# Patient Record
Sex: Female | Born: 1940 | Race: Black or African American | Hispanic: No | Marital: Married | Smoking: Former smoker
Health system: Southern US, Community
[De-identification: ages and names within clinical notes are randomized; demographics above are authoritative.]

## PROBLEM LIST (undated history)

## (undated) DIAGNOSIS — Z9889 Other specified postprocedural states: Secondary | ICD-10-CM

## (undated) DIAGNOSIS — Z8249 Family history of ischemic heart disease and other diseases of the circulatory system: Secondary | ICD-10-CM

## (undated) DIAGNOSIS — K219 Gastro-esophageal reflux disease without esophagitis: Secondary | ICD-10-CM

## (undated) DIAGNOSIS — G4730 Sleep apnea, unspecified: Secondary | ICD-10-CM

## (undated) DIAGNOSIS — F419 Anxiety disorder, unspecified: Secondary | ICD-10-CM

## (undated) DIAGNOSIS — M1990 Unspecified osteoarthritis, unspecified site: Secondary | ICD-10-CM

## (undated) DIAGNOSIS — D696 Thrombocytopenia, unspecified: Secondary | ICD-10-CM

## (undated) DIAGNOSIS — E119 Type 2 diabetes mellitus without complications: Secondary | ICD-10-CM

## (undated) DIAGNOSIS — E785 Hyperlipidemia, unspecified: Secondary | ICD-10-CM

## (undated) DIAGNOSIS — R112 Nausea with vomiting, unspecified: Secondary | ICD-10-CM

## (undated) DIAGNOSIS — D704 Cyclic neutropenia: Secondary | ICD-10-CM

## (undated) DIAGNOSIS — I10 Essential (primary) hypertension: Secondary | ICD-10-CM

## (undated) HISTORY — DX: Sleep apnea, unspecified: G47.30

## (undated) HISTORY — PX: KNEE SURGERY: SHX244

## (undated) HISTORY — DX: Family history of ischemic heart disease and other diseases of the circulatory system: Z82.49

## (undated) HISTORY — DX: Type 2 diabetes mellitus without complications: E11.9

## (undated) HISTORY — DX: Cyclic neutropenia: D70.4

## (undated) HISTORY — DX: Hyperlipidemia, unspecified: E78.5

---

## 1970-07-09 HISTORY — PX: TONSILLECTOMY: SUR1361

## 1983-07-10 HISTORY — PX: VAGINAL HYSTERECTOMY: SUR661

## 2001-07-09 HISTORY — PX: TRANSTHORACIC ECHOCARDIOGRAM: SHX275

## 2002-03-18 ENCOUNTER — Inpatient Hospital Stay (HOSPITAL_COMMUNITY): Admission: EM | Admit: 2002-03-18 | Discharge: 2002-03-19 | Payer: Self-pay | Admitting: *Deleted

## 2002-03-18 ENCOUNTER — Encounter: Payer: Self-pay | Admitting: *Deleted

## 2002-03-19 ENCOUNTER — Encounter: Payer: Self-pay | Admitting: *Deleted

## 2002-11-05 ENCOUNTER — Other Ambulatory Visit: Admission: RE | Admit: 2002-11-05 | Discharge: 2002-11-05 | Payer: Self-pay | Admitting: Obstetrics and Gynecology

## 2002-12-22 ENCOUNTER — Encounter: Payer: Self-pay | Admitting: Emergency Medicine

## 2002-12-22 ENCOUNTER — Emergency Department (HOSPITAL_COMMUNITY): Admission: EM | Admit: 2002-12-22 | Discharge: 2002-12-22 | Payer: Self-pay | Admitting: Emergency Medicine

## 2002-12-24 ENCOUNTER — Ambulatory Visit (HOSPITAL_COMMUNITY): Admission: RE | Admit: 2002-12-24 | Discharge: 2002-12-24 | Payer: Self-pay | Admitting: Orthopaedic Surgery

## 2002-12-24 ENCOUNTER — Encounter: Payer: Self-pay | Admitting: Orthopaedic Surgery

## 2004-09-23 ENCOUNTER — Emergency Department (HOSPITAL_COMMUNITY): Admission: EM | Admit: 2004-09-23 | Discharge: 2004-09-23 | Payer: Self-pay | Admitting: Emergency Medicine

## 2005-07-25 ENCOUNTER — Encounter: Payer: Self-pay | Admitting: Internal Medicine

## 2005-07-25 ENCOUNTER — Ambulatory Visit: Payer: Self-pay | Admitting: Internal Medicine

## 2005-07-25 ENCOUNTER — Ambulatory Visit (HOSPITAL_COMMUNITY): Admission: RE | Admit: 2005-07-25 | Discharge: 2005-07-25 | Payer: Self-pay | Admitting: Internal Medicine

## 2006-09-24 ENCOUNTER — Ambulatory Visit (HOSPITAL_COMMUNITY): Admission: RE | Admit: 2006-09-24 | Discharge: 2006-09-24 | Payer: Self-pay | Admitting: Family Medicine

## 2008-07-15 ENCOUNTER — Ambulatory Visit: Payer: Self-pay | Admitting: Internal Medicine

## 2008-08-09 ENCOUNTER — Encounter: Payer: Self-pay | Admitting: Internal Medicine

## 2008-08-09 ENCOUNTER — Ambulatory Visit: Payer: Self-pay | Admitting: Internal Medicine

## 2008-08-09 ENCOUNTER — Ambulatory Visit (HOSPITAL_COMMUNITY): Admission: RE | Admit: 2008-08-09 | Discharge: 2008-08-09 | Payer: Self-pay | Admitting: Internal Medicine

## 2008-08-09 HISTORY — PX: COLONOSCOPY: SHX174

## 2008-08-12 ENCOUNTER — Encounter: Payer: Self-pay | Admitting: Internal Medicine

## 2008-08-27 ENCOUNTER — Encounter (INDEPENDENT_AMBULATORY_CARE_PROVIDER_SITE_OTHER): Payer: Self-pay

## 2009-11-11 ENCOUNTER — Ambulatory Visit (HOSPITAL_COMMUNITY): Admission: RE | Admit: 2009-11-11 | Discharge: 2009-11-11 | Payer: Self-pay | Admitting: Family Medicine

## 2009-11-21 ENCOUNTER — Ambulatory Visit (HOSPITAL_COMMUNITY): Admission: RE | Admit: 2009-11-21 | Discharge: 2009-11-21 | Payer: Self-pay | Admitting: Family Medicine

## 2010-11-21 NOTE — Op Note (Signed)
Danielle [name], Danielle [name]             ACCOUNT NO.:  [id number]   MEDICAL RECORD NO.:  [id number]          PATIENT TYPE:  AMB   LOCATION:  DAY                           FACILITY:  [facility]   PHYSICIAN:  R. Roetta Sessions, M.D. DATE OF BIRTH:  December 23, 1940   DATE OF PROCEDURE:  DATE OF DISCHARGE:                               OPERATIVE REPORT   Colonoscopy with biopsy.   INDICATIONS FOR PROCEDURE:  The patient is a 70 year old African  American lady with a history of colonic adenoma who is here for  surveillance examination.  Last colonoscopy was in 2007 in which she had  an adenoma removed.  She is having no lower GI tract symptoms.  She is  here somewhat early for surveillance.  She is desirous of colonoscopy  for recheck.  Risks, benefits, alternatives, limitations have been  reviewed previously and again today in the office.  Please see the  documentation in the medical record.   PROCEDURE NOTE:  O2 saturation, blood pressure, pulse, respirations were  monitored throughout the entire procedure.   CONSCIOUS SEDATION:  Versed 3 mg IV, Demerol 50 mg IV in divided doses.   INSTRUMENT:  Pentax video chip system.   FINDINGS:  Digital rectal exam revealed no abnormalities.  Endoscopic  findings:  The prep was good.  Colon:  Colonic mucosa was surveyed from  the rectosigmoid junction through the left transverse, right colon, to  the appendiceal orifice, ileocecal valve, and cecum.  These structures  were well seen and photographed for the record.  From this level, the  scope was slowly and cautiously withdrawn.  All previously mentioned  mucosal surfaces were again seen.  The patient had a few underlying  right-sided diverticula and a tiny polypoid area of mucosa on the  proximal side of the ileocecal valve and the cecum which was cold  biopsied/removed.  Remainder of the colonic mucosa appeared normal.  Scope was pulled down the rectum.  A thorough examination of rectal  mucosa including  retroflexion of the anal verge demonstrated no  abnormalities.  The patient tolerated the procedure well, was reactive  to endoscopy.   IMPRESSION:  Normal rectum, few right-sided diverticula, diminutive  polypoid mucosa at the ileocecal valve status post cold biopsy removal.  Remainder of colonic mucosa appeared normal.   RECOMMENDATIONS:  1. Diverticulosis literature provided to [name].  2. Followup on path.  3. Further recommendations to follow.      Jonathon Bellows, M.D.  Electronically Signed     RMR/MEDQ  D:  08/09/2008  T:  08/09/2008  Job:  [id number]   cc:   Kirk Ruths, M.D.  Fax: [phone]

## 2010-11-21 NOTE — H&P (Signed)
Danielle [name], Danielle [name]             ACCOUNT NO.:  [id number]   MEDICAL RECORD NO.:  [id number]         PATIENT TYPE:  AMB   LOCATION:  DAY                           FACILITY:  [facility]   PHYSICIAN:  R. Roetta Sessions, M.D. DATE OF BIRTH:  05-18-1941   DATE OF ADMISSION:  DATE OF DISCHARGE:  LH                              HISTORY & PHYSICAL   PRIMARY CARE PHYSICIAN:  Kirk Ruths, MD   PHYSICIAN SIGNING NOTE:  Jonathon Bellows, MD   CHIEF COMPLAINT:  Followup surveillance colonoscopy, history of  adenomatous polyp.   HISTORY OF PRESENT ILLNESS:  Danielle [name] is a 70 year old African  female.  She was found to have inflamed focally adenomatous polyp in her  ascending colon on July 25, 2005, on screening colonoscopy by Dr.  Jena Gauss.  The remainder of her exam was normal.  She denies any GI  complaints at this time.  She does have occasional GERD and may take a  couple of Protonix on a p.r.n. basis.  Denies any dysphagia or  odynophagia.  Her weight has remained stable.  Her appetite is good.  She denies any abdominal pain, rectal bleeding, melena, diarrhea, or  constipation.   PAST MEDICAL AND SURGICAL HISTORY:  Adenomatous polyp as described in  HPI, hypercholesterolemia, asthma, seasonal allergies, partial  hysterectomy, tonsillectomy, tubal ligation.   CURRENT MEDICATIONS:  1. Hydrochlorothiazide 125 mg daily.  2. Zocor 40 mg daily.  3. Protonix 40 mg daily.  4. Aspirin 81 mg daily.  5. Centrum multivitamin daily.  6. Omega-3 fish oil daily.  7. Caltrate 600 mg.  8. Vitamin D daily.  9. Garlic oil daily.  10.Vitamin E daily.  11.Travatan at bedtime.  12.Alphagan b.i.d.  13.Albuterol p.r.n.  14.Flovent p.r.n.   ALLERGIES:  No known drug allergies.   FAMILY HISTORY:  There is no known family history of carcinoma or other  chronic GI problems.  Mother deceased at 53 secondary to leukemia.  Father deceased at 27 secondary to pneumonia.  She has 4 healthy   brothers.   SOCIAL HISTORY:  Danielle [name] is married.  She has 1 healthy grown son.  She is a retired Nature conservation officer.  She denies any tobacco or drug  use.  She consumes 1-2 glasses of wine a couple of times per year.   REVIEW OF SYSTEMS:  See HPI, otherwise negative.   PHYSICAL EXAMINATION:  VITAL SIGNS:  Weight 185.5 pounds, height 65  inches, temp 97.7, blood pressure 118/78, pulse 64.  GENERAL:  She is a well-developed, well-nourished [place] American  female who is alert, oriented, pleasant, cooperative in no acute  distress.  HEENT.  Sclerae clear, nonicteric. Conjunctivae pink. Oropharynx pink  and moist without any lesions.  NECK: Supple without mass or thyromegaly.  CHEST:  Heart regular rate and rhythm.  Normal S1 and S2 without  murmurs, clicks, rubs, or gallops.  LUNGS:  Clear to auscultation bilaterally.  ABDOMEN:  Flat with positive bowel sounds x4.  No bruits auscultated.  Soft, nontender, nondistended without palpable mass or organomegaly.  No  rashes or guarding.  EXTREMITIES:  Without clubbing  or edema.   ASSESSMENT:  Danielle [name] is a 70 year old African American female with  history of adenomatous polyp on last colonoscopy in 2007.  She is due  for surveillance.  She denies any GI complaints at this time.   PLAN:  Surveillance colonoscopy with Dr. Jena Gauss.  We discussed the  procedure risks and benefits which include, but are not limited to,  infection, perforation, or drug reaction.  She agrees with the plan and  consent will be obtained.      Lorenza Burton, N.P.      Jonathon Bellows, M.D.  Electronically Signed    KJ/MEDQ  D:  07/15/2008  T:  07/15/2008  Job:  [id number]   cc:   Kirk Ruths, M.D.  Fax: [phone]

## 2010-11-24 NOTE — Op Note (Signed)
Danielle [name], Danielle [name]             ACCOUNT NO.:  [id number]   MEDICAL RECORD NO.:  [id number]          PATIENT TYPE:  AMB   LOCATION:  DAY                           FACILITY:  [facility]   PHYSICIAN:  R. Roetta Sessions, M.D. DATE OF BIRTH:  23-Feb-1941   DATE OF PROCEDURE:  07/25/2005  DATE OF DISCHARGE:                                 OPERATIVE REPORT   PROCEDURE:  Colonoscopy with snare polypectomy.   INDICATIONS FOR PROCEDURE:  Patient is a 70 year old African-American female  with lower GI tract symptoms.  Sent over via the courtesy of Dr. Yetta Numbers for colorectal cancer screening.  She is having lower GI tract  symptoms, as stated above.  No family history of colorectal neoplasia.  She  has never had a lower GI tract image.  Colonoscopy is now being done as a  screening maneuver.  This approach has been discussed with the patient at  length.  Potential risks, benefits and alternatives have been reviewed and  questions answered.  She is agreeable.  Please see documentation of the  medical record.   PROCEDURE:  O2 saturation, blood pressure, pulse oxygenation monitored  throughout the entire procedure.  Conscious sedation with Versed 3 mg IV and  Demerol 50 mg IV in divided doses.   INSTRUMENT:  Olympus videochip system.   FINDINGS:  Digital rectal exam revealed no abnormalities.   ENDOSCOPIC FINDINGS:  Prep was adequate.   RECTAL:  Examination of the rectal mucosa and retroflexed view of the anal  verge revealed no abnormalities.   COLON:  Colonic mucosa was surveyed from the rectosigmoid junction through  the left transverse right colon to the area of the appendiceal orifice,  ileocecal valve, and cecum, where these structures were well seen and  photographed for the record.  From this level, the scope was slowly  withdrawn.  All previously mentioned mucosal surfaces were again seen.  There was a 6 mm pedunculated polyp in the mid ascending colon.  It was cold-  snared and  recovered through the scope.  The remainder of the colonic mucosa  appeared normal.  The patient tolerated the procedure well and was reactive.   ENDOSCOPIC IMPRESSION:  1.  Normal rectum.  2.  Ascending colon polyp removed with snare, as described above.  3.  The remainder of the colonic mucosa appeared normal.   RECOMMENDATIONS:  1.  No aspirin or __________ medications for 10 days.  2.  Follow up on path.  3.  Further recommendations to follow.      Jonathon Bellows, M.D.  Electronically Signed     RMR/MEDQ  D:  07/25/2005  T:  07/25/2005  Job:  [id number]   cc:   Kirk Ruths, M.D.  Fax: [phone]

## 2010-11-24 NOTE — H&P (Signed)
NAME:  [name], [name]                       ACCOUNT NO.:  [id number]   MEDICAL RECORD NO.:  [id number]                   PATIENT TYPE:  INP   LOCATION:  A204                                 FACILITY:  [facility]   PHYSICIAN:  Marcelino Duster, M.D.                 DATE OF BIRTH:  1940-09-15   DATE OF ADMISSION:  03/18/2002  DATE OF DISCHARGE:                                HISTORY & PHYSICAL   PRIMARY CARE PHYSICIAN:  [facility] [place]   CHIEF COMPLAINT:  Chest pain.   HISTORY OF PRESENT ILLNESS:  This is a 70 year old African American lady  with complaints of heaviness in the midsternal region, which is intermittent  in nature, for the past week.  Her pain is unprovoked by exertion.  She  declines to describe this pain and prefers to use the term heaviness in the  chest.  She says it makes her uncomfortable and leaves her drained whenever  she has this pain.  The pain lasts only but a few minutes and resolves  spontaneously.  She had some associated numbness in her right arm this  evening, which prompted her to come to the emergency room.  She is unsure  whether the right arm numbness was because she was scared and anxious after  she had heaviness in her chest.  She denies any shortness of breath.  She  has had no diaphoresis.  She denies any nausea or vomiting with her pain.   PAST MEDICAL HISTORY:  Bronchial asthma, which is well controlled.  Her  history is significantly negative for high blood pressure, negative for  diabetes, and negative for heart disease.   MEDICATION ALLERGIES:  No known drug allergies.   CURRENT MEDICATIONS:  1. Aspirin 81 mg q.d.  2. Ventolin inhaler.  3. Flovent inhaler.  4. Vitamin tablets.   PAST SURGICAL HISTORY:  Partial hysterectomy and tonsillectomy.   FAMILY HISTORY:  The patient's dad is alive at 30 years and has heart  disease.  Her mom died in 2002-02-02from leukemia.   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  She does not smoke and does not use alcohol.   REVIEW OF SYSTEMS:  GENERAL:  The patient denies any fever or chills;  however, describes hot flashes and some night sweats which she has had since  menopause.  NEUROLOGIC:  Headaches, no changes in her vision.  CARDIOVASCULAR:  No shortness of breath.  The rest as described in history  of present illness.  GASTROINTESTINAL:  Normal appetite.  No change in bowel  habits.  GENITOURINARY:  No dysuria or polyuria.  MUSCULOSKELETAL:  No  complaints.  All other systems reviewed are negative.   PHYSICAL EXAMINATION:  GENERAL:  This is a middle-aged [place] American  lady, appears comfortable in bed, has no chest pain presently.  VITAL SIGNS:  Temperature 97.4 degrees, blood pressure 116/76,  heart rate 82  per minute, respiratory rate 16 per minute.  HEENT:  She is normocephalic and atraumatic.  Equal sized pupils reactive to  light.  External ocular movements are full and intact.  The patient wears  eyeglasses.  Throat exam is unremarkable.  NECK:  Supple.  No thyroid masses are felt.  No JVD.  LUNGS:  Clear to auscultation.  Breathing is unlabored and breath sounds are  vesicular.  CARDIOVASCULAR:  Heart sounds 1 and 2 are heard and regular.  No murmurs.  ABDOMEN:  Soft, nontender.  Normal bowel sounds.  No organomegaly.  NEUROLOGIC:  Grossly intact with no focal deficits.  EXTREMITIES:  No pedal edema.   LABORATORY DATA:  EKG:  Sinus rhythm at 76 per minute with no acute ST  segment changes.  Chest x-ray is within normal limits.  No acute  infiltrates.   White count 4.7, hemoglobin 12, hematocrit 35, platelet count 286.  Glucose  is 135, sodium 141, potassium 3.9, BUN 13, creatinine 0.8.  CK 81, MB 1.1,  troponin is less than 0.01.   ASSESSMENT AND PLAN:  1. Chest pain.  It appears the patient's only risk factors for coronary     artery disease are her age and a positive family history.  The     intermittent nature of her  symptoms may represent unstable angina.  We     will admit the patient to telemetry bed and obtain serial cardiac     enzymes.  We will suggest stress echocardiogram if her cardiac enzymes     are negative.  We will also treat the patient empirically for     gastroesophageal reflux disease, which is part of the differential for     her current symptoms.  2. Bronchial asthma, which is currently stable.   Dr. [name] was in care of the patient in the morning.                                                 Marcelino Duster, M.D.    JJ/MEDQ  D:  03/18/2002  T:  03/19/2002  Job:  [id number]

## 2010-11-24 NOTE — Discharge Summary (Signed)
   NAME:  Danielle [name], Danielle [name]                       ACCOUNT NO.:  [id number]   MEDICAL RECORD NO.:  [id number]                   PATIENT TYPE:  INP   LOCATION:  A204                                 FACILITY:  [facility]   PHYSICIAN:  Gracelyn Nurse, M.D.              DATE OF BIRTH:  08-05-1940   DATE OF ADMISSION:  03/18/2002  DATE OF DISCHARGE:  03/19/2002                                 DISCHARGE SUMMARY   DISCHARGE DIAGNOSES:  1. Atypical chest pain.  2. Asthma.  3. Status post hysterectomy.   DISCHARGE MEDICATIONS:  1. Hydrochlorothiazide 25 mg q.d.  2. Albuterol metered dose inhaler p.r.n.   REASON FOR ADMISSION:  This is a 69 year old black female who complained of  some chest heaviness in the mid sternal region on and off for the past week.  She said it lasted a few minutes and resolved spontaneously.  It was  associated with some numbness in her right arm this evening and that  prompted her to come to the emergency room.   HOSPITAL COURSE:  1. ATYPICAL CHEST PAIN:  The patient had a normal EKG.  Cardiac enzymes were     drawn three sets q.8h.  All were normal.  The patient was seen by Dr.     Domingo Sep who felt like this was noncardiac, but did want to see her as an     outpatient in the office to set up an outpatient stress test.  2. HYPERTENSION:  She did have elevated blood pressure that stayed     consistently elevated here in the hospital.  No prior history of     hypertension but we will start her on hydrochlorothiazide and have her     follow up with Dr. Domingo Sep also.   DISPOSITION:  The patient is discharged in stable condition.  As above, she  will follow up with Dr. Domingo Sep in her office for outpatient stress  testing.                                               Gracelyn Nurse, M.D.    JDJ/MEDQ  D:  03/19/2002  T:  03/20/2002  Job:  [id number]   cc:   Sherral Hammers, M.D.

## 2011-06-14 ENCOUNTER — Other Ambulatory Visit (HOSPITAL_COMMUNITY): Payer: Self-pay | Admitting: Orthopaedic Surgery

## 2011-06-14 DIAGNOSIS — M25562 Pain in left knee: Secondary | ICD-10-CM

## 2011-06-18 ENCOUNTER — Other Ambulatory Visit (HOSPITAL_COMMUNITY): Payer: Self-pay

## 2011-06-18 ENCOUNTER — Ambulatory Visit (HOSPITAL_COMMUNITY)
Admission: RE | Admit: 2011-06-18 | Discharge: 2011-06-18 | Disposition: A | Discharge status: Home / Self Care | Payer: Medicare Other | Source: Ambulatory Visit | Attending: Orthopaedic Surgery | Admitting: Orthopaedic Surgery

## 2011-06-18 DIAGNOSIS — M2240 Chondromalacia patellae, unspecified knee: Secondary | ICD-10-CM | POA: Insufficient documentation

## 2011-06-18 DIAGNOSIS — M25569 Pain in unspecified knee: Secondary | ICD-10-CM | POA: Insufficient documentation

## 2011-06-18 DIAGNOSIS — M25562 Pain in left knee: Secondary | ICD-10-CM

## 2011-07-11 ENCOUNTER — Encounter (HOSPITAL_COMMUNITY): Payer: Self-pay

## 2011-07-16 ENCOUNTER — Other Ambulatory Visit: Payer: Self-pay

## 2011-07-16 ENCOUNTER — Encounter (HOSPITAL_COMMUNITY)
Admission: RE | Admit: 2011-07-16 | Discharge: 2011-07-16 | Disposition: A | Discharge status: Home / Self Care | Payer: Medicare Other | Source: Ambulatory Visit | Attending: Orthopaedic Surgery | Admitting: Orthopaedic Surgery

## 2011-07-16 ENCOUNTER — Encounter (HOSPITAL_COMMUNITY): Payer: Self-pay

## 2011-07-16 HISTORY — DX: Anxiety disorder, unspecified: F41.9

## 2011-07-16 HISTORY — DX: Other specified postprocedural states: Z98.890

## 2011-07-16 HISTORY — DX: Nausea with vomiting, unspecified: R11.2

## 2011-07-16 HISTORY — DX: Unspecified osteoarthritis, unspecified site: M19.90

## 2011-07-16 HISTORY — DX: Essential (primary) hypertension: I10

## 2011-07-16 LAB — COMPREHENSIVE METABOLIC PANEL
ALT: 23 U/L (ref 0–35)
AST: 23 U/L (ref 0–37)
Alkaline Phosphatase: 63 U/L (ref 39–117)
CO2: 29 mEq/L (ref 19–32)
GFR calc Af Amer: >90 mL/min (ref >90)
GFR calc non Af Amer: 87 mL/min — ABNORMAL LOW (ref >90)
Glucose, Bld: 111 mg/dL — ABNORMAL HIGH (ref 70–99)
Potassium: 3.8 mEq/L (ref 3.5–5.1)
Total Bilirubin: 0.3 mg/dL (ref 0.3–1.2)

## 2011-07-16 LAB — URINALYSIS, ROUTINE W REFLEX MICROSCOPIC
Nitrite: NEGATIVE
Specific Gravity, Urine: 1.02 (ref 1.005–1.030)
Urobilinogen, UA: 0.2 mg/dL (ref 0.0–1.0)
pH: 7 (ref 5.0–8.0)

## 2011-07-16 LAB — CBC
HCT: 34.6 % — ABNORMAL LOW (ref 36.0–46.0)
MCHC: 33.8 g/dL (ref 30.0–36.0)
RBC: 3.94 MIL/uL (ref 3.87–5.11)

## 2011-07-16 NOTE — Patient Instructions (Addendum)
20 Danielle [name]  07/16/2011   Your procedure is scheduled on:  Thursday, 07/19/11  Report to [name] at 0700 AM.  Call this number if you have problems the morning of surgery: [phone]   Remember:   Do not eat food:After Midnight.  May have clear liquids:until Midnight .  Clear liquids include soda, tea, black coffee, apple or grape juice, broth.  Take these medicines the morning of surgery with A SIP OF WATER: HCTZ, lansoprazole, albuterol, and flovent. Please bring your inhalers with you.   Do not wear jewelry, make-up or nail polish.  Do not wear lotions, powders, or perfumes. You may wear deodorant.  Do not shave 48 hours prior to surgery.  Do not bring valuables to the hospital.  Contacts, dentures or bridgework may not be worn into surgery.  Leave suitcase in the car. After surgery it may be brought to your room.  For patients admitted to the hospital, checkout time is 11:00 AM the day of discharge.   Patients discharged the day of surgery will not be allowed to drive home.  Name and phone number of your driver: driver  Special Instructions: CHG Shower Use Special Wash: 1/2 bottle night before surgery and 1/2 bottle morning of surgery.   Please read over the following fact sheets that you were given: Pain Booklet, MRSA Information, Surgical Site Infection Prevention, Anesthesia Post-op Instructions and Care and Recovery After Surgery   Arthroscopic Procedure, Knee An arthroscopic procedure can find what is wrong with your knee. PROCEDURE Arthroscopy is a surgical technique that allows your orthopedic surgeon to diagnose and treat your knee injury with accuracy. They will look into your knee through a small instrument. This is almost like a small (pencil sized) telescope. Because arthroscopy affects your knee less than open knee surgery, you can anticipate a more rapid recovery. Taking an active role by following your caregiver's instructions will help with rapid and complete  recovery. Use crutches, rest, elevation, ice, and knee exercises as instructed. The length of recovery depends on various factors including type of injury, age, physical condition, medical conditions, and your rehabilitation. Your knee is the joint between the large bones (femur and tibia) in your leg. Cartilage covers these bone ends which are smooth and slippery and allow your knee to bend and move smoothly. Two menisci, thick, semi-lunar shaped pads of cartilage which form a rim inside the joint, help absorb shock and stabilize your knee. Ligaments bind the bones together and support your knee joint. Muscles move the joint, help support your knee, and take stress off the joint itself. Because of this all programs and physical therapy to rehabilitate an injured or repaired knee require rebuilding and strengthening your muscles. AFTER THE PROCEDURE  After the procedure, you will be moved to a recovery area until most of the effects of the medication have worn off. Your caregiver will discuss the test results with you.   Only take over-the-counter or prescription medicines for pain, discomfort, or fever as directed by your caregiver.  SEEK MEDICAL CARE IF:   You have increased bleeding from your wounds.   You see redness, swelling, or have increasing pain in your wounds.   You have pus coming from your wound.   You have an oral temperature above 102 F (38.9 C).   You notice a bad smell coming from the wound or dressing.   You have severe pain with any motion of your knee.  SEEK IMMEDIATE MEDICAL CARE IF:  You develop a rash.   You have difficulty breathing.   You have any allergic problems.  Document Released: 06/22/2000 Document Revised: 03/07/2011 Document Reviewed: 01/14/2008 [facility] Patient Information 2012 [place], [place].

## 2011-07-18 NOTE — H&P (Signed)
Danielle [name] is an 71 y.o. female.   Chief Complaint: Left knee pain HPI: She has history of left knee pain for several months. She has been on pain medicine and Mobic.   At first she just had swelling of the knee and pain.  It would come and go.  She had a MRI of the left knee on June 18, 2011 showing severe chondromalacia of the patella and large loose body 14x13x6 mm adjacent to Hoffa's fat pad in the midline.  She was feeling better when I told her of the findings.  She elected to just wait and see how the knee did.  It did well until the first of this year.  She had locking, deep pain on New Year's Day and the next day.  I saw her on January 2nd.  She wanted to have surgery as she was getting worse and she had fallen when the knee locked on her.  I had discussed the possibility of this with her before.  I went over the risks and imponderables of the knee arthroscopy procedure with her.  She asked appropriate questions and agrees to the elective outpatient knee procedure. She asked to have it January 10th.  Past Medical History  Diagnosis Date  . Hypertension   . COPD (chronic obstructive pulmonary disease)   . Asthma     seasonal; spring/fall  . Arthritis   . Anxiety   . PONV (postoperative nausea and vomiting)   . Glaucoma     Past Surgical History  Procedure Date  . Vaginal hysterectomy 80's    [place], partial  . Tonsillectomy age 89  . Dilation and curettage of uterus     Family History  Problem Relation Age of Onset  . Anesthesia problems Neg Hx   . Hypotension Neg Hx   . Malignant hyperthermia Neg Hx   . Pseudochol deficiency Neg Hx    Social History:  reports that she quit smoking about 33 years ago. Her smoking use included Cigarettes. She does not have any smokeless tobacco history on file. She reports that she drinks alcohol. She reports that she does not use illicit drugs.  Allergies: No Known Allergies  No current facility-administered medications on file  as of .   No current outpatient prescriptions on file as of .    No results found for this or any previous visit (from the past 48 hour(s)). No results found.  Review of Systems  Constitutional: Negative.   HENT: Negative.   Eyes: Negative.   Respiratory: Negative.   Cardiovascular:       History of hypertension  Gastrointestinal: Negative.   Genitourinary: Negative.   Musculoskeletal: Positive for joint pain (pain of the left knee, giving way, now has locking).  Skin: Negative.   Neurological: Negative.   Endo/Heme/Allergies: Negative.   Psychiatric/Behavioral: Negative.     There were no vitals taken for this visit. Physical Exam  Constitutional: She is oriented to person, place, and time. She appears well-developed and well-nourished.  HENT:  Head: Normocephalic and atraumatic.  Eyes: Conjunctivae and EOM are normal. Pupils are equal, round, and reactive to light.  Neck: Normal range of motion. Neck supple.  Cardiovascular: Normal rate, regular rhythm, normal heart sounds and intact distal pulses.   Respiratory: Effort normal and breath sounds normal.  GI: Soft. Bowel sounds are normal.  Musculoskeletal: She exhibits tenderness (Pain of the left knee with effusion and crepitus).       Left knee: She exhibits decreased  range of motion and effusion. tenderness found.       Legs: Neurological: She is alert and oriented to person, place, and time. She has normal reflexes.  Skin: Skin is warm and dry.  Psychiatric: She has a normal mood and affect. Her behavior is normal. Judgment and thought content normal.     Assessment/Plan Left knee pain, chondromalacia and loose body of the left knee.  For arthroscopy of the knee.  Out patient physical therapy post op also.    [name] 07/18/2011, 3:35 PM

## 2011-07-19 ENCOUNTER — Other Ambulatory Visit: Payer: Self-pay | Admitting: Orthopaedic Surgery

## 2011-07-19 ENCOUNTER — Ambulatory Visit (HOSPITAL_COMMUNITY): Payer: Medicare Other | Admitting: Anesthesiology

## 2011-07-19 ENCOUNTER — Encounter (HOSPITAL_COMMUNITY): Payer: Self-pay | Admitting: Anesthesiology

## 2011-07-19 ENCOUNTER — Encounter (HOSPITAL_COMMUNITY): Payer: Self-pay | Admitting: *Deleted

## 2011-07-19 ENCOUNTER — Encounter (HOSPITAL_COMMUNITY)
Admission: RE | Disposition: A | Discharge status: Home / Self Care | Payer: Self-pay | Source: Ambulatory Visit | Attending: Orthopaedic Surgery

## 2011-07-19 ENCOUNTER — Ambulatory Visit (HOSPITAL_COMMUNITY)
Admission: RE | Admit: 2011-07-19 | Discharge: 2011-07-19 | Disposition: A | Discharge status: Home / Self Care | Payer: Medicare Other | Source: Ambulatory Visit | Attending: Orthopaedic Surgery | Admitting: Orthopaedic Surgery

## 2011-07-19 DIAGNOSIS — Z01812 Encounter for preprocedural laboratory examination: Secondary | ICD-10-CM | POA: Insufficient documentation

## 2011-07-19 DIAGNOSIS — M23319 Other meniscus derangements, anterior horn of medial meniscus, unspecified knee: Secondary | ICD-10-CM | POA: Insufficient documentation

## 2011-07-19 DIAGNOSIS — I10 Essential (primary) hypertension: Secondary | ICD-10-CM | POA: Insufficient documentation

## 2011-07-19 DIAGNOSIS — J4489 Other specified chronic obstructive pulmonary disease: Secondary | ICD-10-CM | POA: Insufficient documentation

## 2011-07-19 DIAGNOSIS — J449 Chronic obstructive pulmonary disease, unspecified: Secondary | ICD-10-CM | POA: Insufficient documentation

## 2011-07-19 DIAGNOSIS — Z01810 Encounter for preprocedural cardiovascular examination: Secondary | ICD-10-CM | POA: Insufficient documentation

## 2011-07-19 DIAGNOSIS — M2340 Loose body in knee, unspecified knee: Secondary | ICD-10-CM | POA: Insufficient documentation

## 2011-07-19 DIAGNOSIS — Z79899 Other long term (current) drug therapy: Secondary | ICD-10-CM | POA: Insufficient documentation

## 2011-07-19 HISTORY — PX: KNEE ARTHROSCOPY: SHX127

## 2011-07-19 SURGERY — ARTHROSCOPY, KNEE
Anesthesia: General | Site: Knee | Laterality: Left | Wound class: Clean

## 2011-07-19 MED ORDER — ONDANSETRON HCL 4 MG/2ML IJ SOLN
INTRAMUSCULAR | Status: AC
  Administered 2011-07-19: 4 mg via INTRAVENOUS
  Filled 2011-07-19: qty 2

## 2011-07-19 MED ORDER — OMEGA-3-ACID ETHYL ESTERS 1 G PO CAPS: 1.0000 g | ORAL_CAPSULE | Freq: Every day | ORAL | Status: DC

## 2011-07-19 MED ORDER — ALBUTEROL SULFATE HFA 108 (90 BASE) MCG/ACT IN AERS: 2.0000 | INHALATION_SPRAY | Freq: Four times a day (QID) | RESPIRATORY_TRACT | Status: DC | PRN

## 2011-07-19 MED ORDER — LACTATED RINGERS IV SOLN
INTRAVENOUS | Status: DC | PRN
  Administered 2011-07-19: 07:00:00 via INTRAVENOUS

## 2011-07-19 MED ORDER — SIMVASTATIN 40 MG PO TABS: 40.0000 mg | ORAL_TABLET | Freq: Every day | ORAL | Status: DC

## 2011-07-19 MED ORDER — FLUTICASONE PROPIONATE HFA 110 MCG/ACT IN AERO: 1.0000 | INHALATION_SPRAY | RESPIRATORY_TRACT | Status: DC | PRN

## 2011-07-19 MED ORDER — HYDROCODONE-ACETAMINOPHEN 7.5-325 MG PO TABS: 1.0000 | ORAL_TABLET | ORAL | Status: AC | PRN

## 2011-07-19 MED ORDER — DEXAMETHASONE SODIUM PHOSPHATE 4 MG/ML IJ SOLN
INTRAMUSCULAR | Status: AC
  Administered 2011-07-19: 4 mg via INTRAVENOUS
  Filled 2011-07-19: qty 1

## 2011-07-19 MED ORDER — ONDANSETRON HCL 4 MG/2ML IJ SOLN: 4.0000 mg | Freq: Once | INTRAMUSCULAR | Status: DC | PRN

## 2011-07-19 MED ORDER — ASPIRIN EC 81 MG PO TBEC: 81.0000 mg | DELAYED_RELEASE_TABLET | Freq: Every day | ORAL | Status: DC

## 2011-07-19 MED ORDER — MIDAZOLAM HCL 2 MG/2ML IJ SOLN
INTRAMUSCULAR | Status: AC
  Administered 2011-07-19: 2 mg via INTRAVENOUS
  Filled 2011-07-19: qty 2

## 2011-07-19 MED ORDER — FENTANYL CITRATE 0.05 MG/ML IJ SOLN
INTRAMUSCULAR | Status: AC
  Filled 2011-07-19: qty 2

## 2011-07-19 MED ORDER — LIDOCAINE HCL (CARDIAC) 10 MG/ML IV SOLN
INTRAVENOUS | Status: DC | PRN
  Administered 2011-07-19: 50 mg via INTRAVENOUS

## 2011-07-19 MED ORDER — LIDOCAINE HCL (PF) 1 % IJ SOLN
INTRAMUSCULAR | Status: AC
  Filled 2011-07-19: qty 5

## 2011-07-19 MED ORDER — TRAVOPROST (BAK FREE) 0.004 % OP SOLN: 1.0000 [drp] | Freq: Every day | OPHTHALMIC | Status: DC

## 2011-07-19 MED ORDER — FENTANYL CITRATE 0.05 MG/ML IJ SOLN
INTRAMUSCULAR | Status: DC | PRN
  Administered 2011-07-19 (×3): 25 ug via INTRAVENOUS

## 2011-07-19 MED ORDER — PROPOFOL 10 MG/ML IV EMUL
INTRAVENOUS | Status: DC | PRN
  Administered 2011-07-19: 200 mg via INTRAVENOUS

## 2011-07-19 MED ORDER — LACTATED RINGERS IR SOLN
Status: DC | PRN
  Administered 2011-07-19 (×2): 3000 mL

## 2011-07-19 MED ORDER — MIDAZOLAM HCL 2 MG/2ML IJ SOLN
1.0000 mg | INTRAMUSCULAR | Status: DC | PRN
  Administered 2011-07-19: 2 mg via INTRAVENOUS

## 2011-07-19 MED ORDER — BUPIVACAINE HCL (PF) 0.25 % IJ SOLN
INTRAMUSCULAR | Status: AC
  Filled 2011-07-19: qty 30

## 2011-07-19 MED ORDER — BUPIVACAINE HCL (PF) 0.25 % IJ SOLN
INTRAMUSCULAR | Status: DC | PRN
  Administered 2011-07-19: 30 mL

## 2011-07-19 MED ORDER — FENTANYL CITRATE 0.05 MG/ML IJ SOLN: 25.0000 ug | INTRAMUSCULAR | Status: DC | PRN

## 2011-07-19 MED ORDER — PANTOPRAZOLE SODIUM 20 MG PO TBEC: 20.0000 mg | DELAYED_RELEASE_TABLET | Freq: Every day | ORAL | Status: DC

## 2011-07-19 MED ORDER — BRIMONIDINE TARTRATE 0.15 % OP SOLN: 1.0000 [drp] | Freq: Two times a day (BID) | OPHTHALMIC | Status: DC

## 2011-07-19 MED ORDER — DEXAMETHASONE SODIUM PHOSPHATE 4 MG/ML IJ SOLN
4.0000 mg | Freq: Once | INTRAMUSCULAR | Status: AC
Start: 2011-07-19 — End: 2011-07-19
  Administered 2011-07-19: 4 mg via INTRAVENOUS

## 2011-07-19 MED ORDER — LACTATED RINGERS IV SOLN
INTRAVENOUS | Status: DC
Start: 2011-07-19 — End: 2011-07-19
  Administered 2011-07-19: 1000 mL via INTRAVENOUS

## 2011-07-19 MED ORDER — POTASSIUM 99 MG PO TABS: 1.0000 | ORAL_TABLET | ORAL | Status: DC | PRN

## 2011-07-19 MED ORDER — SODIUM CHLORIDE 0.9 % IR SOLN
Status: DC | PRN
  Administered 2011-07-19: 1000 mL

## 2011-07-19 MED ORDER — HYDROCHLOROTHIAZIDE 25 MG PO TABS: 25.0000 mg | ORAL_TABLET | Freq: Every day | ORAL | Status: DC

## 2011-07-19 MED ORDER — PROPOFOL 10 MG/ML IV EMUL
INTRAVENOUS | Status: AC
  Filled 2011-07-19: qty 20

## 2011-07-19 MED ORDER — ONDANSETRON HCL 4 MG/2ML IJ SOLN
4.0000 mg | Freq: Once | INTRAMUSCULAR | Status: AC
  Administered 2011-07-19: 4 mg via INTRAVENOUS

## 2011-07-19 SURGICAL SUPPLY — 58 items
BAG HAMPER (MISCELLANEOUS) ×3 IMPLANT
BANDAGE ELASTIC 6 VELCRO NS (GAUZE/BANDAGES/DRESSINGS) ×3 IMPLANT
BANDAGE ESMARK 4X12 BL STRL LF (DISPOSABLE) ×2 IMPLANT
BLADE SURG 15 STRL LF DISP TIS (BLADE) ×2 IMPLANT
BLADE SURG 15 STRL SS (BLADE) ×3
BLADE SURG SZ11 CARB STEEL (BLADE) ×3 IMPLANT
BNDG CMPR 12X4 ELC STRL LF (DISPOSABLE) ×2
BNDG ESMARK 4X12 BLUE STRL LF (DISPOSABLE) ×3
CLOTH BEACON ORANGE TIMEOUT ST (SAFETY) ×3 IMPLANT
CUFF TOURNIQUET SINGLE 34IN LL (TOURNIQUET CUFF) IMPLANT
CUFF TOURNIQUET SINGLE 44IN (TOURNIQUET CUFF) ×2 IMPLANT
CUTTER TOMCAT 4.0M (BURR) ×3 IMPLANT
DECANTER SPIKE VIAL GLASS SM (MISCELLANEOUS) ×3 IMPLANT
DRAPE PROXIMA HALF (DRAPES) ×3 IMPLANT
DRSG XEROFORM 1X8 (GAUZE/BANDAGES/DRESSINGS) ×3 IMPLANT
ELECT MENISCUS 165MM 90D (ELECTRODE) IMPLANT
FORMALIN 10 PREFIL 480ML (MISCELLANEOUS) ×3 IMPLANT
GAUZE SPONGE 4X4 16PLY XRAY LF (GAUZE/BANDAGES/DRESSINGS) ×3 IMPLANT
GAUZE XEROFORM 1X8 LF (GAUZE/BANDAGES/DRESSINGS) ×2 IMPLANT
GLOVE BIO SURGEON STRL SZ8 (GLOVE) ×3 IMPLANT
GLOVE BIO SURGEON STRL SZ8.5 (GLOVE) ×3 IMPLANT
GLOVE ECLIPSE 6.5 STRL STRAW (GLOVE) ×2 IMPLANT
GLOVE ECLIPSE 7.0 STRL STRAW (GLOVE) ×2 IMPLANT
GOWN STRL REIN XL XLG (GOWN DISPOSABLE) ×6 IMPLANT
HANDPIECE (MISCELLANEOUS) IMPLANT
HLDR LEG FOAM (MISCELLANEOUS) ×2 IMPLANT
IV LACTATED RINGER IRRG 3000ML (IV SOLUTION) ×9
IV LR IRRIG 3000ML ARTHROMATIC (IV SOLUTION) ×5 IMPLANT
KIT BLADEGUARD II DBL (SET/KITS/TRAYS/PACK) ×3 IMPLANT
KIT ROOM TURNOVER AP CYSTO (KITS) ×3 IMPLANT
KNIFE HOOK (BLADE) IMPLANT
KNIFE MENISECTOMY (BLADE) IMPLANT
LEG HOLDER FOAM (MISCELLANEOUS) ×1
MANIFOLD NEPTUNE II (INSTRUMENTS) ×3 IMPLANT
MANIFOLD NEPTUNE WASTE (CANNULA) ×3 IMPLANT
MARKER SKIN DUAL TIP RULER LAB (MISCELLANEOUS) ×3 IMPLANT
NDL HYPO 21X1.5 SAFETY (NEEDLE) ×1 IMPLANT
NDL SPNL 18GX3.5 QUINCKE PK (NEEDLE) ×1 IMPLANT
NEEDLE HYPO 21X1.5 SAFETY (NEEDLE) ×3 IMPLANT
NEEDLE SPNL 18GX3.5 QUINCKE PK (NEEDLE) ×3 IMPLANT
NS IRRIG 1000ML POUR BTL (IV SOLUTION) ×3 IMPLANT
PACK ARTHRO LIMB DRAPE STRL (MISCELLANEOUS) ×3 IMPLANT
PAD ABD 5X9 TENDERSORB (GAUZE/BANDAGES/DRESSINGS) ×3 IMPLANT
PAD ARMBOARD 7.5X6 YLW CONV (MISCELLANEOUS) ×3 IMPLANT
PADDING CAST COTTON 6X4 STRL (CAST SUPPLIES) ×3 IMPLANT
SET ARTHROSCOPY INST (INSTRUMENTS) ×3 IMPLANT
SET BASIN LINEN APH (SET/KITS/TRAYS/PACK) ×3 IMPLANT
SOL PREP PROV IODINE SCRUB 4OZ (MISCELLANEOUS) ×3 IMPLANT
SPONGE GAUZE 4X4 12PLY (GAUZE/BANDAGES/DRESSINGS) ×3 IMPLANT
SUT ETHILON 3 0 FSL (SUTURE) ×3 IMPLANT
SYR 30ML LL (SYRINGE) ×3 IMPLANT
TIP 0 DEGREE (MISCELLANEOUS) IMPLANT
TIP 30 DEGREE (MISCELLANEOUS) IMPLANT
TIP 70 DEGREE (MISCELLANEOUS) IMPLANT
TOWEL OR 17X26 4PK STRL BLUE (TOWEL DISPOSABLE) ×3 IMPLANT
TUBING FLOSTEADY ARTHRO PUMP (IRRIGATION / IRRIGATOR) ×3 IMPLANT
WATER STERILE IRR 1000ML POUR (IV SOLUTION) ×3 IMPLANT
YANKAUER SUCT BULB TIP 10FT TU (MISCELLANEOUS) ×6 IMPLANT

## 2011-07-19 NOTE — Progress Notes (Signed)
Pt states she has crutches and knows how to crutch walk. 

## 2011-07-19 NOTE — Brief Op Note (Signed)
07/19/2011  9:19 AM  PATIENT:  Danielle [name]  71 y.o. female  PRE-OPERATIVE DIAGNOSIS:  loose body left knee  POST-OPERATIVE DIAGNOSIS:  loose body left knee, plus tear of the anterior horn of the medial meniscus  PROCEDURE:  Procedure(s): ARTHROSCOPY KNEE on the left, removal of loose body, partial medial menisectomy anterior horn   SURGEON:  Surgeon(s): [employer]  PHYSICIAN ASSISTANT:   ASSISTANTS: none   ANESTHESIA:   general  EBL:  Total I/O In: 300 [I.V.:300] Out: 5 [Blood:5]  BLOOD ADMINISTERED:none  DRAINS: none   LOCAL MEDICATIONS USED:  MARCAINE 30CC  SPECIMEN:  Source of Specimen:  Large loose body and shavings of medial meniscus left knee  DISPOSITION OF SPECIMEN:  PATHOLOGY  COUNTS:  YES  TOURNIQUET:   Total Tourniquet Time Documented: Thigh (Left) - 26 minutes  DICTATION: .Other Dictation: Dictation Number [id number]  PLAN OF CARE: Discharge to home after PACU  PATIENT DISPOSITION:  PACU - hemodynamically stable.   Delay start of Pharmacological VTE agent (>24hrs) due to surgical blood loss or risk of bleeding: n/a

## 2011-07-19 NOTE — Anesthesia Preprocedure Evaluation (Addendum)
Anesthesia Evaluation  Patient identified by MRN, date of birth, ID band Patient awake    Reviewed: Allergy & Precautions, H&P , NPO status , Patient's Chart, lab work & pertinent test results  History of Anesthesia Complications (+) PONV  Airway Mallampati: II      Dental  (+) Teeth Intact   Pulmonary asthma , COPD COPD inhaler,  clear to auscultation        Cardiovascular hypertension, Pt. on medications Regular Normal    Neuro/Psych Anxiety    GI/Hepatic   Endo/Other    Renal/GU      Musculoskeletal   Abdominal   Peds  Hematology   Anesthesia Other Findings   Reproductive/Obstetrics                           Anesthesia Physical Anesthesia Plan  ASA: II  Anesthesia Plan: General   Post-op Pain Management:    Induction: Intravenous  Airway Management Planned: LMA  Additional Equipment:   Intra-op Plan:   Post-operative Plan:   Informed Consent: I have reviewed the patients History and Physical, chart, labs and discussed the procedure including the risks, benefits and alternatives for the proposed anesthesia with the patient or authorized representative who has indicated his/her understanding and acceptance.     Plan Discussed with:   Anesthesia Plan Comments:         Anesthesia Quick Evaluation

## 2011-07-19 NOTE — Anesthesia Procedure Notes (Signed)
Procedure Name: LMA Insertion Date/Time: 07/19/2011 8:35 AM Performed by: Carolyne Littles, AMY Pre-anesthesia Checklist: Patient identified, Patient being monitored, Emergency Drugs available, Timeout performed and Suction available Patient Re-evaluated:Patient Re-evaluated prior to inductionOxygen Delivery Method: Circle System Utilized Preoxygenation: Pre-oxygenation with 100% oxygen Intubation Type: IV induction Ventilation: Mask ventilation without difficulty LMA: LMA inserted LMA Size: 4.0 Number of attempts: 1 Placement Confirmation: positive ETCO2 and breath sounds checked- equal and bilateral Dental Injury: Teeth and Oropharynx as per pre-operative assessment

## 2011-07-19 NOTE — Progress Notes (Signed)
2 puffs ventolin inhaler prior to leaving pre-op.

## 2011-07-19 NOTE — Progress Notes (Signed)
The History and Physical is unchanged. I have examined the patient. The patient is medically able to have surgery on the left knee . Danielle [name]  

## 2011-07-19 NOTE — Anesthesia Postprocedure Evaluation (Signed)
  Anesthesia Post-op Note  Patient: Danielle [name]  Procedure(s) Performed:  ARTHROSCOPY KNEE - partial medial menisectomy anterior horn, removal of loose body; HOLMIUM LASER APPLICATION  Patient Location: PACU  Anesthesia Type: General  Level of Consciousness: awake, alert , oriented and patient cooperative  Airway and Oxygen Therapy: Patient Spontanous Breathing and Patient connected to face mask oxygen  Post-op Pain: none  Post-op Assessment: Post-op Vital signs reviewed, Patient's Cardiovascular Status Stable, Respiratory Function Stable, Patent Airway and No signs of Nausea or vomiting  Post-op Vital Signs: Reviewed and stable  Complications: No apparent anesthesia complications

## 2011-07-19 NOTE — Preoperative (Signed)
Beta Blockers   Reason not to administer Beta Blockers:Not Applicable 

## 2011-07-19 NOTE — Transfer of Care (Signed)
Immediate Anesthesia Transfer of Care Note  Patient: Danielle [name]  Procedure(s) Performed:  ARTHROSCOPY KNEE - partial medial menisectomy anterior horn, removal of [name] body; HOLMIUM LASER APPLICATION  Patient Location: PACU  Anesthesia Type: General  Level of Consciousness: awake, alert , oriented and patient cooperative  Airway & Oxygen Therapy: Patient Spontanous Breathing and Patient connected to face mask oxygen  Post-op Assessment: Report given to PACU RN and Post -op Vital signs reviewed and stable  Post vital signs: Reviewed and stable  Complications: No apparent anesthesia complications

## 2011-07-20 NOTE — Op Note (Signed)
NAMEKALIE, CABRAL             ACCOUNT NO.:  [id number]  MEDICAL RECORD NO.:  [id number]  LOCATION:  [facility]                          FACILITY:  [facility]  PHYSICIAN:  J. Darreld Mclean, M.D. DATE OF BIRTH:  [birth year]  DATE OF PROCEDURE:  07/19/2011 DATE OF DISCHARGE:  07/19/2011                              OPERATIVE REPORT   PREOPERATIVE DIAGNOSIS:  Loose body, left knee.  POSTOPERATIVE DIAGNOSIS:  Loose body, left knee plus tear of the anterior horn of the medial meniscus, left knee.  PROCEDURES:  Arthroscopy of the left knee.  Removal of large loose body and partial medial meniscectomy, anterior horn.  ANESTHESIA:  General.  TOURNIQUET TIME:  26 minutes.  DRAINS:  None.  SURGEON:  J. Darreld Mclean, MD  INDICATIONS:  The patient is a 71 year old female with pain and tenderness in her left knee.  It is getting progressively worse.  MRI showed a large loose body in the knee, but she was not having symptoms at that time and she elected not to have surgery.  However, between the time, she had an MRI when I saw her on the second day of January, she had had episodes of locking pain particularly the new year's Day.  Pain is getting worse.  I recommended removal of the loose body by arthroscopy.  She elected to have surgery today on July 19, 2011. This is an outpatient procedure.  I gone over the risk and imponderables of the procedure.  She appeared to understand and asked appropriate questions and signed an operative note in the office.  DESCRIPTION OF PROCEDURE:  The patient was seen in the holding area and identified the left knee as correct surgical site.  I placed a mark on the left knee.  I also talked to her husband.  The patient was brought to the operating room and placed supine on the operating room table. She was given general anesthesia.  Tourniquet and leg holder were placed, deflated the left upper thigh.  She was prepped and draped in usual manner.  Had a  generalized time-out identifying the patient as Ms. Schlosser and we were doing the left knee for left knee arthroscopy and removal of loose body.  All instrumentation was properly working and placed, and operating room team knew each other.  Leg was then elevated, wrapped circumferentially with an Esmarch bandage.  Tourniquet was inflated to 300 mmHg.  Esmarch bandage was removed.  Inflow cannula was inserted medially and lactated Ringer's was instilled into the knee by an infusion pump.  Arthroscope was inserted laterally and the knee was systematically examined.  There were some grade 2 changes on the undersurface of the patella and the suprapatellar pouch looked good. Medially, there were some changes of the femur and were something that had caused some slight erosion to the distal femur more centrally of the medial condyle.  Medial meniscus was intact without knee tears. Anterior cruciate was intact and removing some of the fat pad with a meniscal shaver.  There was a large loose body more on the lateral side of the knee right near the anterior cruciate.  Laterally, the knee looked normal.  Meniscus looked normal.  No tear.  Using a large grasping forceps, the loose body was removed.  It should be noted prior to removing the loose body, there was apparent tear of the anterior horn of the medial meniscus and when I brought in the meniscal shaver to remove some of the excess tissue in the fat pad area, I removed some part of the meniscus.  Once the loose body had been removed and taken off and put on the back table, I went and used meniscal shaver and smoothed out the anterior horn that was torn.  I could  see changes in the meniscus and femur here where the loose body must have caught and caused "skuff" changes of the articular cartilage.  The anterior meniscus was smoothed  very nicely.  The knee was then systematically examined, no new pathology found, no other loose bodies found.   Wounds were reapproximated using 3-0 nylon interrupted vertical mattress manner.  It should be noted that the medial wound was larger than normal because of loose body was so large, we had to open the wound up, so we could remove it properly.  A 0.25% Marcaine was instilled in each portal for total of 30 mL.  Tourniquet was deflated.  Sterile dressing was applied.  The patient tolerated the procedure well.  She will be given Norco for pain and I will see her in the office for approximately 10 days.  Physical therapy has been arranged.  If any difficulties, should contact me through the office hospital beeper system.          ______________________________ Shela Commons. Darreld Mclean, M.D.     JWK/MEDQ  D:  07/19/2011  T:  07/20/2011  Job:  [id number]

## 2011-07-23 ENCOUNTER — Ambulatory Visit (HOSPITAL_COMMUNITY): Payer: Medicare Other | Admitting: Physical Therapy

## 2011-07-23 ENCOUNTER — Encounter (HOSPITAL_COMMUNITY): Payer: Self-pay | Admitting: Orthopaedic Surgery

## 2011-09-14 ENCOUNTER — Other Ambulatory Visit (HOSPITAL_COMMUNITY): Payer: Self-pay | Admitting: Orthopaedic Surgery

## 2011-09-14 DIAGNOSIS — R52 Pain, unspecified: Secondary | ICD-10-CM

## 2011-09-17 ENCOUNTER — Ambulatory Visit (HOSPITAL_COMMUNITY)
Admission: RE | Admit: 2011-09-17 | Discharge: 2011-09-17 | Disposition: A | Discharge status: Home / Self Care | Payer: Medicare Other | Source: Ambulatory Visit | Attending: Orthopaedic Surgery | Admitting: Orthopaedic Surgery

## 2011-09-17 DIAGNOSIS — M23319 Other meniscus derangements, anterior horn of medial meniscus, unspecified knee: Secondary | ICD-10-CM | POA: Insufficient documentation

## 2011-09-17 DIAGNOSIS — M7120 Synovial cyst of popliteal space [Baker], unspecified knee: Secondary | ICD-10-CM | POA: Insufficient documentation

## 2011-09-17 DIAGNOSIS — M25569 Pain in unspecified knee: Secondary | ICD-10-CM | POA: Insufficient documentation

## 2011-09-17 DIAGNOSIS — R52 Pain, unspecified: Secondary | ICD-10-CM

## 2011-09-17 DIAGNOSIS — M25469 Effusion, unspecified knee: Secondary | ICD-10-CM | POA: Insufficient documentation

## 2012-04-02 ENCOUNTER — Other Ambulatory Visit (HOSPITAL_COMMUNITY): Payer: Self-pay | Admitting: Family Medicine

## 2012-04-02 DIAGNOSIS — Z139 Encounter for screening, unspecified: Secondary | ICD-10-CM

## 2012-04-04 ENCOUNTER — Ambulatory Visit (HOSPITAL_COMMUNITY)
Admission: RE | Admit: 2012-04-04 | Discharge: 2012-04-04 | Disposition: A | Discharge status: Home / Self Care | Payer: Medicare Other | Source: Ambulatory Visit | Attending: Family Medicine | Admitting: Family Medicine

## 2012-04-04 DIAGNOSIS — Z139 Encounter for screening, unspecified: Secondary | ICD-10-CM

## 2012-04-04 DIAGNOSIS — Z780 Asymptomatic menopausal state: Secondary | ICD-10-CM | POA: Insufficient documentation

## 2012-04-04 DIAGNOSIS — Z13820 Encounter for screening for osteoporosis: Secondary | ICD-10-CM | POA: Insufficient documentation

## 2012-10-29 ENCOUNTER — Ambulatory Visit (HOSPITAL_COMMUNITY): Payer: Medicare Other | Admitting: Oncology

## 2012-11-07 ENCOUNTER — Encounter (HOSPITAL_COMMUNITY): Payer: Self-pay | Admitting: Oncology

## 2012-11-07 ENCOUNTER — Encounter (HOSPITAL_COMMUNITY): Payer: Medicare Other | Attending: Oncology | Admitting: Oncology

## 2012-11-07 VITALS — BP 128/63 | HR 78 | Temp 98.4°F | Resp 16 | Ht 64.25 in | Wt 167.0 lb

## 2012-11-07 DIAGNOSIS — D72819 Decreased white blood cell count, unspecified: Secondary | ICD-10-CM | POA: Insufficient documentation

## 2012-11-07 DIAGNOSIS — H409 Unspecified glaucoma: Secondary | ICD-10-CM

## 2012-11-07 DIAGNOSIS — R7309 Other abnormal glucose: Secondary | ICD-10-CM | POA: Insufficient documentation

## 2012-11-07 LAB — CBC WITH DIFFERENTIAL/PLATELET
Basophils Absolute: 0 10*3/uL (ref 0.0–0.1)
Eosinophils Relative: 2 % (ref 0–5)
HCT: 37.7 % (ref 36.0–46.0)
Hemoglobin: 12.8 g/dL (ref 12.0–15.0)
Lymphocytes Relative: 36 % (ref 12–46)
Lymphs Abs: 1.5 10*3/uL (ref 0.7–4.0)
MCV: 87.9 fL (ref 78.0–100.0)
Monocytes Absolute: 0.2 10*3/uL (ref 0.1–1.0)
Monocytes Relative: 5 % (ref 3–12)
Neutro Abs: 2.4 10*3/uL (ref 1.7–7.7)
RBC: 4.29 MIL/uL (ref 3.87–5.11)
WBC: 4.2 10*3/uL (ref 4.0–10.5)

## 2012-11-07 NOTE — Progress Notes (Signed)
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 Her father is also deceased but not from a blood disorder. She has no siblings with this history that she is aware of. She has a son but she does not know any issues with his blood either. She is married for the second time. She is a retired Patent attorney. She does not have any B. symptomatology. She does not have fevers chills night sweats. She has lost 8-10 pounds in the last 12 months that has been successful. She is not aware of any lymphadenopathy. She has no change in her bowel habits. Many years ago she was treated for urinary tract infections but she states that her urologist Dr. Jerre Simon felt that she did not truly have urinary tract infections versus just spasm etc.  She is not a smoker. She drinks a rare glass of wine.  Vital signs are recorded. She is in no acute distress. She is alert and oriented. She does wear a hairpiece. Pupils are equally round a small reactive node with to light. She does have early cataracts. Neck exam is unremarkable. She has no thyromegaly. She has no lymphadenopathy in the cervical, supraclavicular, infraclavicular, axillary, or inguinal areas or epitrochlear areas. Skin exam shows no boils. Breast exam is negative for masses. Lungs are clear to auscultation and percussion. Heart shows a regular rhythm and rate without murmur rub or gallop. Abdomen is soft and nontender without hepatosplenomegaly. I could not feel a spleen even in the right lateral decubitus position. Bowel sounds are  normal. She has pulses are 1+ symmetrical. She does have bilateral knee arthritis    We will repeat her blood work with smear and see her back in October but I do think this is most likely hereditary and not of clinical concern. We will perform a few other tests but I suspect they will be noncontributory. Most of my time spent reassuring her that this is hopefully not going to be a major clinical issue.

## 2012-11-07 NOTE — Patient Instructions (Addendum)
[facility] Discharge Instructions  RECOMMENDATIONS MADE BY THE CONSULTANT AND ANY TEST RESULTS WILL BE SENT TO YOUR REFERRING PHYSICIAN.  EXAM FINDINGS BY THE PHYSICIAN TODAY AND SIGNS OR SYMPTOMS TO REPORT TO CLINIC OR PRIMARY PHYSICIAN: Exam and discussion by MD.  Think that your low white blood count is probably your normal.  Would like to see your CBCs that were done 4, 6, 8 and 10 years ago.  If you can get those results to [place] it would be helpful.  MEDICATIONS PRESCRIBED:  none  INSTRUCTIONS GIVEN AND DISCUSSED: Report fevers, recurring infections, night sweats, etc.  SPECIAL INSTRUCTIONS/FOLLOW-UP: Blood work today and again in October and to be seen in Follow-up in October after your blood work.  Thank you for choosing [name] to provide your oncology and hematology care.  To afford each patient quality time with our providers, please arrive at least 15 minutes before your scheduled appointment time.  With your help, our goal is to use those 15 minutes to complete the necessary work-up to ensure our physicians have the information they need to help with your evaluation and healthcare recommendations.    Effective January 1st, 2014, we ask that you re-schedule your appointment with our physicians should you arrive 10 or more minutes late for your appointment.  We strive to give you quality time with our providers, and arriving late affects you and other patients whose appointments are after yours.    Again, thank you for choosing [facility].  Our hope is that these requests will decrease the amount of time that you wait before being seen by our physicians.       _____________________________________________________________  Should you have questions after your visit to [facility], please contact our office at [phone] between the hours of 8:30 a.m. and 5:00 p.m.  Voicemails left after 4:30 p.m. will not be returned  until the following business day.  For prescription refill requests, have your pharmacy contact our office with your prescription refill request.

## 2012-11-08 LAB — FOLATE: Folate: >20 ng/mL

## 2012-11-08 LAB — VITAMIN B12: Vitamin B-12: 1546 pg/mL — ABNORMAL HIGH (ref 211–911)

## 2012-12-18 ENCOUNTER — Other Ambulatory Visit: Payer: Self-pay

## 2012-12-18 MED ORDER — SIMVASTATIN 40 MG PO TABS: 40.0000 mg | ORAL_TABLET | Freq: Every day | ORAL | Status: DC

## 2012-12-18 NOTE — Telephone Encounter (Signed)
Rx was sent to pharmacy electronically. 

## 2013-03-29 DIAGNOSIS — D709 Neutropenia, unspecified: Secondary | ICD-10-CM | POA: Insufficient documentation

## 2013-03-29 NOTE — Patient Instructions (Addendum)
.  [facility] Discharge Instructions  RECOMMENDATIONS MADE BY THE CONSULTANT AND ANY TEST RESULTS WILL BE SENT TO YOUR REFERRING PHYSICIAN.  EXAM FINDINGS BY THE PHYSICIAN TODAY AND SIGNS OR SYMPTOMS TO REPORT TO CLINIC OR PRIMARY PHYSICIAN: Exam and findings as discussed by Dr. Zigmund Daniel.  INSTRUCTIONS/FOLLOW-UP: 6 months, labs before visit  Thank you for choosing [name] to provide your oncology and hematology care.  To afford each patient quality time with our providers, please arrive at least 15 minutes before your scheduled appointment time.  With your help, our goal is to use those 15 minutes to complete the necessary work-up to ensure our physicians have the information they need to help with your evaluation and healthcare recommendations.    Effective January 1st, 2014, we ask that you re-schedule your appointment with our physicians should you arrive 10 or more minutes late for your appointment.  We strive to give you quality time with our providers, and arriving late affects you and other patients whose appointments are after yours.    Again, thank you for choosing [facility].  Our hope is that these requests will decrease the amount of time that you wait before being seen by our physicians.       _____________________________________________________________  Should you have questions after your visit to [facility], please contact our office at [phone] between the hours of 8:30 a.m. and 5:00 p.m.  Voicemails left after 4:30 p.m. will not be returned until the following business day.  For prescription refill requests, have your pharmacy contact our office with your prescription refill request.

## 2013-04-06 ENCOUNTER — Encounter (HOSPITAL_COMMUNITY): Payer: Medicare Other | Attending: Hematology and Oncology

## 2013-04-06 DIAGNOSIS — D709 Neutropenia, unspecified: Secondary | ICD-10-CM | POA: Insufficient documentation

## 2013-04-06 LAB — CBC WITH DIFFERENTIAL/PLATELET
Basophils Absolute: 0 10*3/uL (ref 0.0–0.1)
Eosinophils Absolute: 0.1 10*3/uL (ref 0.0–0.7)
Eosinophils Relative: 2 % (ref 0–5)
Lymphs Abs: 1.4 10*3/uL (ref 0.7–4.0)
MCH: 29.5 pg (ref 26.0–34.0)
MCV: 89.4 fL (ref 78.0–100.0)
Monocytes Absolute: 0.2 10*3/uL (ref 0.1–1.0)
Platelets: 245 10*3/uL (ref 150–400)
RDW: 13.8 % (ref 11.5–15.5)

## 2013-04-06 NOTE — Progress Notes (Signed)
Labs drawn today for cbc/diff 

## 2013-04-08 ENCOUNTER — Encounter (HOSPITAL_COMMUNITY): Payer: Medicare Other | Attending: Hematology and Oncology

## 2013-04-08 ENCOUNTER — Encounter (HOSPITAL_COMMUNITY): Payer: Self-pay

## 2013-04-08 VITALS — BP 130/81 | HR 74 | Temp 97.7°F | Resp 18 | Wt 169.7 lb

## 2013-04-08 DIAGNOSIS — D709 Neutropenia, unspecified: Secondary | ICD-10-CM | POA: Insufficient documentation

## 2013-04-08 DIAGNOSIS — M1990 Unspecified osteoarthritis, unspecified site: Secondary | ICD-10-CM

## 2013-04-08 NOTE — Progress Notes (Signed)
[facility] OFFICE PROGRESS NOTE  Colette Ribas, MD [address] [place] [address]  DIAGNOSIS: Neutropenia - Plan: CBC with Differential  Chief Complaint  Patient presents with  . Follow-up    CURRENT THERAPY: Watchful expectation.  INTERVAL HISTORY: Danielle [name] 72 y.o. female returns for followup of benign cyclic neutropenia. Continue to do well particularly since recent diagnosis of obstructive sleep apnea syndrome and the application of CPAP. She denies frequent infections, fever, night sweats, easy satiety, lower; or redness, PND, orthopnea, palpitations, headache, cough, wheezing, skin rash, headache, or seizures. She did receive influenza virus vaccine.   MEDICAL HISTORY: Past Medical History  Diagnosis Date  . Hypertension   . COPD (chronic obstructive pulmonary disease)   . Asthma     seasonal; spring/fall  . Arthritis   . Anxiety   . PONV (postoperative nausea and vomiting)   . Glaucoma     INTERIM HISTORY: has Neutropenia on her problem list.    ALLERGIES:  has No Known Allergies.  MEDICATIONS: has a current medication list which includes the following prescription(s): albuterol, aspirin ec, brimonidine, calcium-vitamin d, fluticasone, hydrochlorothiazide, lansoprazole, multiple vitamins-minerals, fish oil, potassium, simvastatin, and travoprost (bak free).  SURGICAL HISTORY:  Past Surgical History  Procedure Laterality Date  . Vaginal hysterectomy  80's    [place], partial  . Tonsillectomy  age 70  . Dilation and curettage of uterus    . Knee arthroscopy  07/19/2011    Procedure: ARTHROSCOPY KNEE;  Surgeon: Darreld Mclean;  Location: [facility];  Service: Orthopedics;  Laterality: Left;  partial medial menisectomy anterior horn, removal of loose body    FAMILY HISTORY: family history includes Cancer in her maternal grandfather, mother, and paternal grandmother; Clotting disorder in her brother; Diabetes in  her brother. There is no history of Hypotension, Malignant hyperthermia, or Pseudochol deficiency.  SOCIAL HISTORY:  reports that she quit smoking about 34 years ago. Her smoking use included Cigarettes. She smoked 0.00 packs per day. She has never used smokeless tobacco. She reports that  drinks alcohol. She reports that she does not use illicit drugs.  REVIEW OF SYSTEMS:  Other than that discussed above is noncontributory.  PHYSICAL EXAMINATION: ECOG PERFORMANCE STATUS: 0 - Asymptomatic  Blood pressure 130/81, pulse 74, temperature 97.7 F (36.5 C), temperature source Oral, resp. rate 18, weight 169 lb 11.2 oz (76.975 kg).  GENERAL:alert, no distress and comfortable SKIN: skin color, texture, turgor are normal, no rashes or significant lesions EYES: normal, Conjunctiva are pink and non-injected, sclera clear OROPHARYNX:no exudate, no erythema and lips, buccal mucosa, and tongue normal  NECK: supple, thyroid normal size, non-tender, without nodularity CHEST: Normal AP diameter with no breast masses. LYMPH:  no palpable lymphadenopathy in the cervical, axillary or inguinal LUNGS: clear to auscultation and percussion with normal breathing effort HEART: regular rate & rhythm and no murmurs and no lower extremity edema ABDOMEN:abdomen soft, non-tender and normal bowel sounds Musculoskeletal:no cyanosis of digits and no clubbing  NEURO: alert & oriented x 3 with fluent speech, no focal motor/sensory deficits   LABORATORY DATA: Infusion on 04/06/2013  Component Date Value Range Status  . WBC 04/06/2013 3.8* 4.0 - 10.5 K/uL Final  . RBC 04/06/2013 4.04  3.87 - 5.11 MIL/uL Final  . Hemoglobin 04/06/2013 11.9* 12.0 - 15.0 g/dL Final  . HCT 40/98/1191 36.1  36.0 - 46.0 % Final  . MCV 04/06/2013 89.4  78.0 - 100.0 fL Final  . MCH  04/06/2013 29.5  26.0 - 34.0 pg Final  . MCHC 04/06/2013 33.0  30.0 - 36.0 g/dL Final  . RDW 16/04/9603 13.8  11.5 - 15.5 % Final  . Platelets 04/06/2013 245  150  - 400 K/uL Final  . Neutrophils Relative % 04/06/2013 55  43 - 77 % Final  . Neutro Abs 04/06/2013 2.1  1.7 - 7.7 K/uL Final  . Lymphocytes Relative 04/06/2013 37  12 - 46 % Final  . Lymphs Abs 04/06/2013 1.4  0.7 - 4.0 K/uL Final  . Monocytes Relative 04/06/2013 6  3 - 12 % Final  . Monocytes Absolute 04/06/2013 0.2  0.1 - 1.0 K/uL Final  . Eosinophils Relative 04/06/2013 2  0 - 5 % Final  . Eosinophils Absolute 04/06/2013 0.1  0.0 - 0.7 K/uL Final  . Basophils Relative 04/06/2013 1  0 - 1 % Final  . Basophils Absolute 04/06/2013 0.0  0.0 - 0.1 K/uL Final     Urinalysis    Component Value Date/Time   COLORURINE YELLOW 07/16/2011 0930   APPEARANCEUR CLEAR 07/16/2011 0930   LABSPEC 1.020 07/16/2011 0930   PHURINE 7.0 07/16/2011 0930   GLUCOSEU NEGATIVE 07/16/2011 0930   HGBUR SMALL* 07/16/2011 0930   BILIRUBINUR NEGATIVE 07/16/2011 0930   KETONESUR NEGATIVE 07/16/2011 0930   PROTEINUR NEGATIVE 07/16/2011 0930   UROBILINOGEN 0.2 07/16/2011 0930   NITRITE NEGATIVE 07/16/2011 0930   LEUKOCYTESUR TRACE* 07/16/2011 0930    RADIOGRAPHIC STUDIES: No results found.  ASSESSMENT: #1. Benign cyclic neutropenia, stable. #2. Obstructive sleep apnea syndrome, on CPAP. #3. Glaucoma, on treatment. #4. 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   PLAN: #1. Watchful expectation with recommendation to call should she develop fever so that appropriate antibiotics could be started and blood count check. #2. Continue CPAP therapy. #3. Office visit with labs in 6 months.   All questions were answered. The patient knows to call the clinic with any problems, questions or concerns. We can certainly see the patient much sooner if necessary.   I spent 30 minutes counseling the patient face to face. The total time spent in the appointment was 25 minutes.    Maurilio Lovely, MD 04/08/2013 10:48 AM

## 2013-09-25 ENCOUNTER — Encounter (INDEPENDENT_AMBULATORY_CARE_PROVIDER_SITE_OTHER): Payer: Self-pay

## 2013-09-25 ENCOUNTER — Encounter: Payer: Self-pay | Admitting: Gastroenterology

## 2013-09-25 ENCOUNTER — Ambulatory Visit (INDEPENDENT_AMBULATORY_CARE_PROVIDER_SITE_OTHER): Payer: Medicare Other | Admitting: Gastroenterology

## 2013-09-25 ENCOUNTER — Encounter (HOSPITAL_COMMUNITY): Payer: Self-pay | Admitting: Pharmacy Technician

## 2013-09-25 VITALS — BP 110/65 | HR 78 | Temp 98.6°F | Ht 65.0 in | Wt 176.0 lb

## 2013-09-25 DIAGNOSIS — R1314 Dysphagia, pharyngoesophageal phase: Secondary | ICD-10-CM

## 2013-09-25 DIAGNOSIS — Z86010 Personal history of colonic polyps: Secondary | ICD-10-CM | POA: Insufficient documentation

## 2013-09-25 DIAGNOSIS — Z860101 Personal history of adenomatous and serrated colon polyps: Secondary | ICD-10-CM | POA: Insufficient documentation

## 2013-09-25 DIAGNOSIS — R1310 Dysphagia, unspecified: Secondary | ICD-10-CM

## 2013-09-25 DIAGNOSIS — R1319 Other dysphagia: Secondary | ICD-10-CM | POA: Insufficient documentation

## 2013-09-25 DIAGNOSIS — K219 Gastro-esophageal reflux disease without esophagitis: Secondary | ICD-10-CM | POA: Insufficient documentation

## 2013-09-25 MED ORDER — PEG 3350-KCL-NA BICARB-NACL 420 G PO SOLR: 4000.0000 mL | ORAL | Status: DC

## 2013-09-25 NOTE — Progress Notes (Signed)
Primary Care Physician:  Purvis Kilts, MD  Primary Gastroenterologist:  Garfield Cornea, MD   Chief Complaint  Patient presents with  . Colonoscopy    HPI:  Danielle [name] is a 73 y.o. female here to schedule five-year surveillance colonoscopy. Her last colonoscopy was in February 2010. At that time she had right-sided diverticulosis,  diminutive polypoid mucosa at the ICV with unremarkable biopsy. She has a history of colonic adenomas back in 2007.   Overall she's been doing well. Denies any complaints of constipation, diarrhea, melena, rectal bleeding. Denies abdominal pain. She has chronic GERD. She's been on PPI over 10 years ago when she was hospitalized with atypical chest pain felt to be related to GERD. No prior upper endoscopy. Complains of dysphagia to solid foods if she does not take her time chewing her food thoroughly. Currently on over-the-counter Prevacid as needed for heartburn.    Current Outpatient Prescriptions  Medication Sig Dispense Refill  . albuterol (PROVENTIL HFA;VENTOLIN HFA) 108 (90 BASE) MCG/ACT inhaler Inhale 2 puffs into the lungs every 6 (six) hours as needed. For shortness of breath       . aspirin EC 81 MG tablet Take 81 mg by mouth daily.        . brimonidine (ALPHAGAN) 0.15 % ophthalmic solution Place 1 drop into both eyes 2 (two) times daily.        . Calcium-Vitamin D (CALTRATE 600 PLUS-VIT D PO) Take 1 tablet by mouth daily.        . fluticasone (FLOVENT HFA) 110 MCG/ACT inhaler Inhale 1 puff into the lungs as needed. For shortness of breath       . hydrochlorothiazide (HYDRODIURIL) 25 MG tablet Take 25 mg by mouth daily.        . lansoprazole (PREVACID) 15 MG capsule Take 15 mg by mouth as needed. For indigestion      . Multiple Vitamins-Minerals (CENTRUM SILVER PO) Take 1 capsule by mouth daily.      . Omega-3 Fatty Acids (FISH OIL) 1000 MG CAPS Take 1 capsule by mouth daily.        . Potassium 99 MG TABS Take 1 tablet by mouth as needed.  Takes infrequently with no particular schedule      . simvastatin (ZOCOR) 40 MG tablet Take 1 tablet (40 mg total) by mouth at bedtime.  30 tablet  11  . Travoprost, BAK Free, (TRAVATAN) 0.004 % SOLN ophthalmic solution Place 1 drop into both eyes at bedtime.         No current facility-administered medications for this visit.    Allergies as of 09/25/2013  . (No Known Allergies)    Past Medical History  Diagnosis Date  . Hypertension   . Asthma     seasonal; spring/fall  . Arthritis   . Anxiety   . PONV (postoperative nausea and vomiting)   . Glaucoma   . Sleep apnea     CPAP  . Neutropenia, cyclic     Benign, followed by hematology    Past Surgical History  Procedure Laterality Date  . Vaginal hysterectomy  86's    [place], partial  . Tonsillectomy  age 74  . Knee arthroscopy  07/19/2011    Procedure: ARTHROSCOPY KNEE;  Surgeon: Sanjuana Kava;  Location: [facility];  Service: Orthopedics;  Laterality: Left;  partial medial menisectomy anterior horn, removal of loose body  . Colonoscopy  08/09/2008      GYI:RSWNIO rectum, few right-sided diverticula, diminutive  polypoid mucosa  at the ileocecal valve status post cold biopsy removal Remainder of colonic mucosa appeared normal    Family History  Problem Relation Age of Onset  . Hypotension Neg Hx   . Malignant hyperthermia Neg Hx   . Pseudochol deficiency Neg Hx   . Cancer Mother   . Diabetes Brother   . Cancer Maternal Grandfather   . Cancer Paternal Grandmother     kidney failure  . Clotting disorder Brother   . Colon cancer Neg Hx     History   Social History  . Marital Status: Married    Spouse Name: N/A    Number of Children: 1  . Years of Education: N/A   Occupational History  . Retired Tourist information centre manager    Social History Main Topics  . Smoking status: Former Smoker    Types: Cigarettes    Quit date: 07/15/1978  . Smokeless tobacco: Never Used  . Alcohol Use: Yes     Comment: occasional  . Drug Use: No  .  Sexual Activity: Not on file   Other Topics Concern  . Not on file   Social History Narrative  . No narrative on file      ROS:  General: Negative for anorexia, weight loss, fever, chills, fatigue, weakness. Eyes: Negative for vision changes.  ENT: Negative for hoarseness, difficulty swallowing , nasal congestion. CV: Negative for chest pain, angina, palpitations, dyspnea on exertion, peripheral edema.  Respiratory: Negative for dyspnea at rest, dyspnea on exertion, cough, sputum, wheezing.  GI: See history of present illness. GU:  Negative for dysuria, hematuria, urinary incontinence, urinary frequency, nocturnal urination.  MS: Negative for joint pain, low back pain.  Derm: Negative for rash or itching.  Neuro: Negative for weakness, abnormal sensation, seizure, frequent headaches, memory loss, confusion.  Psych: Negative for anxiety, depression, suicidal ideation, hallucinations.  Endo: Negative for unusual weight change.  Heme: Negative for bruising or bleeding. Allergy: Negative for rash or hives.    Physical Examination:  BP 110/65  Pulse 78  Temp(Src) 98.6 F (37 C) (Oral)  Ht 5\' 5"  (1.651 m)  Wt 176 lb (79.833 kg)  BMI 29.29 kg/m2   General: Well-nourished, well-developed in no acute distress.  Head: Normocephalic, atraumatic.   Eyes: Conjunctiva pink, no icterus. Mouth: Oropharyngeal mucosa moist and pink , no lesions erythema or exudate. Neck: Supple without thyromegaly, masses, or lymphadenopathy.  Lungs: Clear to auscultation bilaterally.  Heart: Regular rate and rhythm, no murmurs rubs or gallops.  Abdomen: Bowel sounds are normal, nontender, nondistended, no hepatosplenomegaly or masses, no abdominal bruits or    hernia , no rebound or guarding. Rectal: Deferred Extremities: No lower extremity edema. No clubbing or deformities.  Neuro: Alert and oriented x 4 , grossly normal neurologically.  Skin: Warm and dry, no rash or jaundice.   Psych: Alert and  cooperative, normal mood and affect.  Labs: Lab Results  Component Value Date   WBC 3.8* 04/06/2013   HGB 11.9* 04/06/2013   HCT 36.1 04/06/2013   MCV 89.4 04/06/2013   PLT 245 04/06/2013   Lab Results  Component Value Date   [id number] 2671* 11/07/2012   Lab Results  Component Value Date   FOLATE >20.0 11/07/2012     Imaging Studies: No results found.

## 2013-09-25 NOTE — Assessment & Plan Note (Signed)
73 year old lady with history of colonic adenomas, due for 5 years surveillance colonoscopy.  I have discussed the risks, alternatives, benefits with regards to but not limited to the risk of reaction to medication, bleeding, infection, perforation and the patient is agreeable to proceed. Written consent to be obtained.

## 2013-09-25 NOTE — Assessment & Plan Note (Signed)
Chronic GERD, on PPI for over 10 years. No prior upper endoscopy. Typical symptoms well controlled. Complains of solid food dysphagia. ?esophageal web or stricture. EGD/ED in near future.  I have discussed the risks, alternatives, benefits with regards to but not limited to the risk of reaction to medication, bleeding, infection, perforation and the patient is agreeable to proceed. Written consent to be obtained.

## 2013-09-25 NOTE — Patient Instructions (Signed)
1. Colonoscopy with upper endoscopy with Dr. Gala Romney. Please see separate instructions.

## 2013-09-28 ENCOUNTER — Telehealth: Payer: Self-pay

## 2013-09-28 NOTE — Telephone Encounter (Signed)
Agree 

## 2013-09-28 NOTE — Telephone Encounter (Signed)
Pt called and said she has completed a little over half of her Trilyte prep. Her stools are running clear now. But, the left side of her tongue is swelling some. She has never had this happen before, she has not done anything different.  I told her she should go to the ED with tongue swelling and I also spoke to Laban Emperor, NP who said she definitely should go to the ED if her tongue is swelling. I explained to her that her airway could close and she needs to be checked out. She said she is on the comode and she cannot go to the ED. I told her to call EMS and let them take her. She said it is swelling just a little on one side.   I still told her to go to the ED. Per Laban Emperor, NP, I told her if her stools are running clear, she may stop the Trilyte prep, and take her other Ducolax. Also her enema in the morning. But to please call us in the morning early and let us know how she is.

## 2013-09-28 NOTE — Progress Notes (Signed)
cc'd to pcp 

## 2013-09-29 ENCOUNTER — Encounter (HOSPITAL_COMMUNITY): Payer: Self-pay | Admitting: *Deleted

## 2013-09-29 ENCOUNTER — Encounter (HOSPITAL_COMMUNITY)
Admission: RE | Disposition: A | Discharge status: Home / Self Care | Payer: Self-pay | Source: Ambulatory Visit | Attending: Internal Medicine

## 2013-09-29 ENCOUNTER — Ambulatory Visit (HOSPITAL_COMMUNITY)
Admission: RE | Admit: 2013-09-29 | Discharge: 2013-09-29 | Disposition: A | Discharge status: Home / Self Care | Payer: Medicare Other | Source: Ambulatory Visit | Attending: Internal Medicine | Admitting: Internal Medicine

## 2013-09-29 DIAGNOSIS — D126 Benign neoplasm of colon, unspecified: Secondary | ICD-10-CM | POA: Insufficient documentation

## 2013-09-29 DIAGNOSIS — K5730 Diverticulosis of large intestine without perforation or abscess without bleeding: Secondary | ICD-10-CM | POA: Insufficient documentation

## 2013-09-29 DIAGNOSIS — K219 Gastro-esophageal reflux disease without esophagitis: Secondary | ICD-10-CM

## 2013-09-29 DIAGNOSIS — F411 Generalized anxiety disorder: Secondary | ICD-10-CM | POA: Insufficient documentation

## 2013-09-29 DIAGNOSIS — Z860101 Personal history of adenomatous and serrated colon polyps: Secondary | ICD-10-CM

## 2013-09-29 DIAGNOSIS — R1310 Dysphagia, unspecified: Secondary | ICD-10-CM | POA: Insufficient documentation

## 2013-09-29 DIAGNOSIS — Z860100 Personal history of colon polyps, unspecified: Secondary | ICD-10-CM | POA: Insufficient documentation

## 2013-09-29 DIAGNOSIS — Z7982 Long term (current) use of aspirin: Secondary | ICD-10-CM | POA: Insufficient documentation

## 2013-09-29 DIAGNOSIS — I10 Essential (primary) hypertension: Secondary | ICD-10-CM | POA: Insufficient documentation

## 2013-09-29 DIAGNOSIS — Z79899 Other long term (current) drug therapy: Secondary | ICD-10-CM | POA: Insufficient documentation

## 2013-09-29 DIAGNOSIS — Z87891 Personal history of nicotine dependence: Secondary | ICD-10-CM | POA: Insufficient documentation

## 2013-09-29 DIAGNOSIS — K222 Esophageal obstruction: Secondary | ICD-10-CM | POA: Insufficient documentation

## 2013-09-29 DIAGNOSIS — R1319 Other dysphagia: Secondary | ICD-10-CM

## 2013-09-29 DIAGNOSIS — Z09 Encounter for follow-up examination after completed treatment for conditions other than malignant neoplasm: Secondary | ICD-10-CM | POA: Insufficient documentation

## 2013-09-29 HISTORY — PX: COLONOSCOPY, ESOPHAGOGASTRODUODENOSCOPY (EGD) AND ESOPHAGEAL DILATION: SHX5781

## 2013-09-29 HISTORY — DX: Gastro-esophageal reflux disease without esophagitis: K21.9

## 2013-09-29 SURGERY — COLONOSCOPY, ESOPHAGOGASTRODUODENOSCOPY (EGD) AND ESOPHAGEAL DILATION (ED)
Anesthesia: Moderate Sedation

## 2013-09-29 MED ORDER — ONDANSETRON HCL 4 MG/2ML IJ SOLN
INTRAMUSCULAR | Status: AC
  Filled 2013-09-29: qty 2

## 2013-09-29 MED ORDER — SODIUM CHLORIDE 0.9 % IV SOLN: INTRAVENOUS | Status: DC

## 2013-09-29 MED ORDER — MEPERIDINE HCL 100 MG/ML IJ SOLN
INTRAMUSCULAR | Status: DC | PRN
  Administered 2013-09-29: 50 mg via INTRAVENOUS
  Administered 2013-09-29: 25 mg via INTRAVENOUS

## 2013-09-29 MED ORDER — MIDAZOLAM HCL 5 MG/5ML IJ SOLN
INTRAMUSCULAR | Status: DC | PRN
  Administered 2013-09-29 (×2): 1 mg via INTRAVENOUS
  Administered 2013-09-29 (×2): 2 mg via INTRAVENOUS

## 2013-09-29 MED ORDER — MIDAZOLAM HCL 5 MG/5ML IJ SOLN
INTRAMUSCULAR | Status: AC
  Filled 2013-09-29: qty 10

## 2013-09-29 MED ORDER — ONDANSETRON HCL 4 MG/2ML IJ SOLN
INTRAMUSCULAR | Status: DC | PRN
  Administered 2013-09-29: 4 mg via INTRAVENOUS

## 2013-09-29 MED ORDER — LIDOCAINE VISCOUS 2 % MT SOLN
OROMUCOSAL | Status: AC
  Filled 2013-09-29: qty 15

## 2013-09-29 MED ORDER — STERILE WATER FOR IRRIGATION IR SOLN
Status: DC | PRN
  Administered 2013-09-29: 13:00:00

## 2013-09-29 MED ORDER — MEPERIDINE HCL 100 MG/ML IJ SOLN
INTRAMUSCULAR | Status: AC
  Filled 2013-09-29: qty 2

## 2013-09-29 MED ORDER — LIDOCAINE VISCOUS 2 % MT SOLN
OROMUCOSAL | Status: DC | PRN
  Administered 2013-09-29: 1 via OROMUCOSAL

## 2013-09-29 NOTE — Interval H&P Note (Signed)
History and Physical Interval Note:  09/29/2013 12:33 PM  Danielle [name]  has presented today for surgery, with the diagnosis of CHRONIC GERD, COLON POLYPS  AND DYSPHAGIA  The various methods of treatment have been discussed with the patient and family. After consideration of risks, benefits and other options for treatment, the patient has consented to  Procedure(s) with comments: COLONOSCOPY, ESOPHAGOGASTRODUODENOSCOPY (EGD) AND ESOPHAGEAL DILATION (ED) (N/A) - 12:15 as a surgical intervention .  The patient's history has been reviewed, patient examined, no change in status, stable for surgery.  I have reviewed the patient's chart and labs.  Questions were answered to the patient's satisfaction.     No change. EGD with dilation and colonoscopy per plan.The risks, benefits, limitations, imponderables and alternatives regarding both EGD and colonoscopy have been reviewed with the patient. Questions have been answered. All parties agreeable.   Manus Rudd

## 2013-09-29 NOTE — H&P (View-Only) (Signed)
Primary Care Physician:  Purvis Kilts, MD  Primary Gastroenterologist:  Garfield Cornea, MD   Chief Complaint  Patient presents with  . Colonoscopy    HPI:  Danielle [name] is a 73 y.o. female here to schedule five-year surveillance colonoscopy. Her last colonoscopy was in February 2010. At that time she had right-sided diverticulosis,  diminutive polypoid mucosa at the ICV with unremarkable biopsy. She has a history of colonic adenomas back in 2007.   Overall she's been doing well. Denies any complaints of constipation, diarrhea, melena, rectal bleeding. Denies abdominal pain. She has chronic GERD. She's been on PPI over 10 years ago when she was hospitalized with atypical chest pain felt to be related to GERD. No prior upper endoscopy. Complains of dysphagia to solid foods if she does not take her time chewing her food thoroughly. Currently on over-the-counter Prevacid as needed for heartburn.    Current Outpatient Prescriptions  Medication Sig Dispense Refill  . albuterol (PROVENTIL HFA;VENTOLIN HFA) 108 (90 BASE) MCG/ACT inhaler Inhale 2 puffs into the lungs every 6 (six) hours as needed. For shortness of breath       . aspirin EC 81 MG tablet Take 81 mg by mouth daily.        . brimonidine (ALPHAGAN) 0.15 % ophthalmic solution Place 1 drop into both eyes 2 (two) times daily.        . Calcium-Vitamin D (CALTRATE 600 PLUS-VIT D PO) Take 1 tablet by mouth daily.        . fluticasone (FLOVENT HFA) 110 MCG/ACT inhaler Inhale 1 puff into the lungs as needed. For shortness of breath       . hydrochlorothiazide (HYDRODIURIL) 25 MG tablet Take 25 mg by mouth daily.        . lansoprazole (PREVACID) 15 MG capsule Take 15 mg by mouth as needed. For indigestion      . Multiple Vitamins-Minerals (CENTRUM SILVER PO) Take 1 capsule by mouth daily.      . Omega-3 Fatty Acids (FISH OIL) 1000 MG CAPS Take 1 capsule by mouth daily.        . Potassium 99 MG TABS Take 1 tablet by mouth as needed.  Takes infrequently with no particular schedule      . simvastatin (ZOCOR) 40 MG tablet Take 1 tablet (40 mg total) by mouth at bedtime.  30 tablet  11  . Travoprost, BAK Free, (TRAVATAN) 0.004 % SOLN ophthalmic solution Place 1 drop into both eyes at bedtime.         No current facility-administered medications for this visit.    Allergies as of 09/25/2013  . (No Known Allergies)    Past Medical History  Diagnosis Date  . Hypertension   . Asthma     seasonal; spring/fall  . Arthritis   . Anxiety   . PONV (postoperative nausea and vomiting)   . Glaucoma   . Sleep apnea     CPAP  . Neutropenia, cyclic     Benign, followed by hematology    Past Surgical History  Procedure Laterality Date  . Vaginal hysterectomy  12's    [place], partial  . Tonsillectomy  age 9  . Knee arthroscopy  07/19/2011    Procedure: ARTHROSCOPY KNEE;  Surgeon: Sanjuana Kava;  Location: [facility];  Service: Orthopedics;  Laterality: Left;  partial medial menisectomy anterior horn, removal of loose body  . Colonoscopy  08/09/2008      WUJ:WJXBJY rectum, few right-sided diverticula, diminutive  polypoid mucosa  at the ileocecal valve status post cold biopsy removal Remainder of colonic mucosa appeared normal    Family History  Problem Relation Age of Onset  . Hypotension Neg Hx   . Malignant hyperthermia Neg Hx   . Pseudochol deficiency Neg Hx   . Cancer Mother   . Diabetes Brother   . Cancer Maternal Grandfather   . Cancer Paternal Grandmother     kidney failure  . Clotting disorder Brother   . Colon cancer Neg Hx     History   Social History  . Marital Status: Married    Spouse Name: N/A    Number of Children: 1  . Years of Education: N/A   Occupational History  . Retired Tourist information centre manager    Social History Main Topics  . Smoking status: Former Smoker    Types: Cigarettes    Quit date: 07/15/1978  . Smokeless tobacco: Never Used  . Alcohol Use: Yes     Comment: occasional  . Drug Use: No  .  Sexual Activity: Not on file   Other Topics Concern  . Not on file   Social History Narrative  . No narrative on file      ROS:  General: Negative for anorexia, weight loss, fever, chills, fatigue, weakness. Eyes: Negative for vision changes.  ENT: Negative for hoarseness, difficulty swallowing , nasal congestion. CV: Negative for chest pain, angina, palpitations, dyspnea on exertion, peripheral edema.  Respiratory: Negative for dyspnea at rest, dyspnea on exertion, cough, sputum, wheezing.  GI: See history of present illness. GU:  Negative for dysuria, hematuria, urinary incontinence, urinary frequency, nocturnal urination.  MS: Negative for joint pain, low back pain.  Derm: Negative for rash or itching.  Neuro: Negative for weakness, abnormal sensation, seizure, frequent headaches, memory loss, confusion.  Psych: Negative for anxiety, depression, suicidal ideation, hallucinations.  Endo: Negative for unusual weight change.  Heme: Negative for bruising or bleeding. Allergy: Negative for rash or hives.    Physical Examination:  BP 110/65  Pulse 78  Temp(Src) 98.6 F (37 C) (Oral)  Ht 5\' 5"  (1.651 m)  Wt 176 lb (79.833 kg)  BMI 29.29 kg/m2   General: Well-nourished, well-developed in no acute distress.  Head: Normocephalic, atraumatic.   Eyes: Conjunctiva pink, no icterus. Mouth: Oropharyngeal mucosa moist and pink , no lesions erythema or exudate. Neck: Supple without thyromegaly, masses, or lymphadenopathy.  Lungs: Clear to auscultation bilaterally.  Heart: Regular rate and rhythm, no murmurs rubs or gallops.  Abdomen: Bowel sounds are normal, nontender, nondistended, no hepatosplenomegaly or masses, no abdominal bruits or    hernia , no rebound or guarding. Rectal: Deferred Extremities: No lower extremity edema. No clubbing or deformities.  Neuro: Alert and oriented x 4 , grossly normal neurologically.  Skin: Warm and dry, no rash or jaundice.   Psych: Alert and  cooperative, normal mood and affect.  Labs: Lab Results  Component Value Date   WBC 3.8* 04/06/2013   HGB 11.9* 04/06/2013   HCT 36.1 04/06/2013   MCV 89.4 04/06/2013   PLT 245 04/06/2013   Lab Results  Component Value Date   [id number] 1062* 11/07/2012   Lab Results  Component Value Date   FOLATE >20.0 11/07/2012     Imaging Studies: No results found.

## 2013-09-29 NOTE — Interval H&P Note (Signed)
History and Physical Interval Note:  09/29/2013 12:33 PM  Danielle [name]  has presented today for surgery, with the diagnosis of CHRONIC GERD, COLON POLYPS  AND DYSPHAGIA  The various methods of treatment have been discussed with the patient and family. After consideration of risks, benefits and other options for treatment, the patient has consented to  Procedure(s) with comments: COLONOSCOPY, ESOPHAGOGASTRODUODENOSCOPY (EGD) AND ESOPHAGEAL DILATION (ED) (N/A) - 12:15 as a surgical intervention .  The patient's history has been reviewed, patient examined, no change in status, stable for surgery.  I have reviewed the patient's chart and labs.  Questions were answered to the patient's satisfaction.     No change.  Manus Rudd

## 2013-09-29 NOTE — Op Note (Signed)
[facility] [address] [place], [address]   COLONOSCOPY PROCEDURE REPORT  PATIENT: Danielle [name], Danielle [name]  MR#:         [id number] BIRTHDATE: 1940-09-23 , 5  yrs. old GENDER: Female ENDOSCOPIST: R.  Garfield Cornea, MD FACP FACG REFERRED BY:  Sharilyn Sites, M.D. PROCEDURE DATE:  09/29/2013 PROCEDURE:     Colonoscopy with snare polypectomy  INDICATIONS: History of colonic adenoma; please note patient developed tongue swelling from Tri-lytely last evening requiring no specific intervention  INFORMED CONSENT:  The risks, benefits, alternatives and imponderables including but not limited to bleeding, perforation as well as the possibility of a missed lesion have been reviewed.  The potential for biopsy, lesion removal, etc. have also been discussed.  Questions have been answered.  All parties agreeable. Please see the history and physical in the medical record for more information.  MEDICATIONS: Versed 6 mg IV and Demerol 75 mg IV in divided doses. Zofran 4 mg IV.  DESCRIPTION OF PROCEDURE:  After a digital rectal exam was performed, the EG-[id number] ([id number]) and EC-3890Li ([id number]) colonoscope was advanced from the anus through the rectum and colon to the area of the cecum, ileocecal valve and appendiceal orifice. The cecum was deeply intubated.  These structures were well-seen and photographed for the record.  From the level of the cecum and ileocecal valve, the scope was slowly and cautiously withdrawn. The mucosal surfaces were carefully surveyed utilizing scope tip deflection to facilitate fold flattening as needed.  The scope was pulled down into the rectum where a thorough examination including retroflexion was performed.    FINDINGS:  Adequate preparation.  Normal rectum. Scattered left-sided diverticula; (1) 6 mm polyp on a fold in the mid ascending segment; otherwise, the remainder of the colonic mucosa appeared normal.  THERAPEUTIC / DIAGNOSTIC  MANEUVERS PERFORMED:  The above-mentioned polyp was hot snare removed  COMPLICATIONS: None  CECAL WITHDRAWAL TIME:  11 minutes  IMPRESSION:  Colonic diverticulosis.  Single colonic polyp-removed as described above  RECOMMENDATIONS: Followup on pathology. Tri-lytely preparation will be added to her list of allergies. See EGD report.   _______________________________ eSigned:  R. Garfield Cornea, MD FACP [facility] 09/29/2013 1:17 PM   CC:    PATIENT NAME:  Danielle [name], Danielle [name] MR#: [id number]

## 2013-09-29 NOTE — Telephone Encounter (Signed)
Pt called this AM for an update. She said after she quit drinking the prep, her tongue swelling went down. She did not have any more problems. She took the other ducolax tablets. Her stools were clear.   Pt is prepared to go in for her colonoscopy today. She had nothing to eat or drink after midnight. She will do her Fleet enema before she goes as directed. Per Laban Emperor, NP yesterday, if pt's stools were running clear and she was able to do the ducolax and no more problems she could present at the hospital this morning for her colonoscopy and EGD.  Pt was advise to let the staff at [facility] know about this incident.

## 2013-09-29 NOTE — Telephone Encounter (Signed)
Noted  

## 2013-09-29 NOTE — Discharge Instructions (Addendum)
Colonoscopy Discharge Instructions  Read the instructions outlined below and refer to this sheet in the next few weeks. These discharge instructions provide you with general information on caring for yourself after you leave the hospital. Your doctor may also give you specific instructions. While your treatment has been planned according to the most current medical practices available, unavoidable complications occasionally occur. If you have any problems or questions after discharge, call Dr. Gala Romney at [phone]. ACTIVITY  You may resume your regular activity, but move at a slower pace for the next 24 hours.   Take frequent rest periods for the next 24 hours.   Walking will help get rid of the air and reduce the bloated feeling in your belly (abdomen).   No driving for 24 hours (because of the medicine (anesthesia) used during the test).    Do not sign any important legal documents or operate any machinery for 24 hours (because of the anesthesia used during the test).  NUTRITION  Drink plenty of fluids.   You may resume your normal diet as instructed by your doctor.   Begin with a light meal and progress to your normal diet. Heavy or fried foods are harder to digest and may make you feel sick to your stomach (nauseated).   Avoid alcoholic beverages for 24 hours or as instructed.  MEDICATIONS  You may resume your normal medications unless your doctor tells you otherwise.  WHAT YOU CAN EXPECT TODAY  Some feelings of bloating in the abdomen.   Passage of more gas than usual.   Spotting of blood in your stool or on the toilet paper.  IF YOU HAD POLYPS REMOVED DURING THE COLONOSCOPY:  No aspirin products for 7 days or as instructed.   No alcohol for 7 days or as instructed.   Eat a soft diet for the next 24 hours.  FINDING OUT THE RESULTS OF YOUR TEST Not all test results are available during your visit. If your test results are not back during the visit, make an appointment  with your caregiver to find out the results. Do not assume everything is normal if you have not heard from your caregiver or the medical facility. It is important for you to follow up on all of your test results.  SEEK IMMEDIATE MEDICAL ATTENTION IF:  You have more than a spotting of blood in your stool.   Your belly is swollen (abdominal distention).   You are nauseated or vomiting.   You have a temperature over 101.  You have abdominal pain or discomfort that is severe or gets worse throughout the day. EGD Discharge instructions Please read the instructions outlined below and refer to this sheet in the next few weeks. These discharge instructions provide you with general information on caring for yourself after you leave the hospital. Your doctor may also give you specific instructions. While your treatment has been planned according to the most current medical practices available, unavoidable complications occasionally occur. If you have any problems or questions after discharge, please call your doctor. ACTIVITY You may resume your regular activity but move at a slower pace for the next 24 hours.  Take frequent rest periods for the next 24 hours.  Walking will help expel (get rid of) the air and reduce the bloated feeling in your abdomen.  No driving for 24 hours (because of the anesthesia (medicine) used during the test).  You may shower.  Do not sign any important legal documents or operate any machinery for 24  hours (because of the anesthesia used during the test).  NUTRITION Drink plenty of fluids.  You may resume your normal diet.  Begin with a light meal and progress to your normal diet.  Avoid alcoholic beverages for 24 hours or as instructed by your caregiver.  MEDICATIONS You may resume your normal medications unless your caregiver tells you otherwise.  WHAT YOU CAN EXPECT TODAY You may experience abdominal discomfort such as a feeling of fullness or gas pains.   FOLLOW-UP Your doctor will discuss the results of your test with you.  SEEK IMMEDIATE MEDICAL ATTENTION IF ANY OF THE FOLLOWING OCCUR: Excessive nausea (feeling sick to your stomach) and/or vomiting.  Severe abdominal pain and distention (swelling).  Trouble swallowing.  Temperature over 101 F (37.8 C).  Rectal bleeding or vomiting of blood.     GERD, polyp and diverticulosis information provided  Begin Prevacid 30 mg daily  Swallowing precautions reviewed  Do not ever take trii-lytely again  Further recommendations to follow pending review of pathology report   Gastroesophageal Reflux Disease, Adult Gastroesophageal reflux disease (GERD) happens when acid from your stomach flows up into the esophagus. When acid comes in contact with the esophagus, the acid causes soreness (inflammation) in the esophagus. Over time, GERD may create small holes (ulcers) in the lining of the esophagus. CAUSES   Increased body weight. This puts pressure on the stomach, making acid rise from the stomach into the esophagus.  Smoking. This increases acid production in the stomach.  Drinking alcohol. This causes decreased pressure in the lower esophageal sphincter (valve or ring of muscle between the esophagus and stomach), allowing acid from the stomach into the esophagus.  Late evening meals and a full stomach. This increases pressure and acid production in the stomach.  A malformed lower esophageal sphincter. Sometimes, no cause is found. SYMPTOMS   Burning pain in the lower part of the mid-chest behind the breastbone and in the mid-stomach area. This may occur twice a week or more often.  Trouble swallowing.  Sore throat.  Dry cough.  Asthma-like symptoms including chest tightness, shortness of breath, or wheezing. DIAGNOSIS  Your caregiver may be able to diagnose GERD based on your symptoms. In some cases, X-rays and other tests may be done to check for complications or to check the  condition of your stomach and esophagus. TREATMENT  Your caregiver may recommend over-the-counter or prescription medicines to help decrease acid production. Ask your caregiver before starting or adding any new medicines.  HOME CARE INSTRUCTIONS   Change the factors that you can control. Ask your caregiver for guidance concerning weight loss, quitting smoking, and alcohol consumption.  Avoid foods and drinks that make your symptoms worse, such as:  Caffeine or alcoholic drinks.  Chocolate.  Peppermint or mint flavorings.  Garlic and onions.  Spicy foods.  Citrus fruits, such as oranges, lemons, or limes.  Tomato-based foods such as sauce, chili, salsa, and pizza.  Fried and fatty foods.  Avoid lying down for the 3 hours prior to your bedtime or prior to taking a nap.  Eat small, frequent meals instead of large meals.  Wear loose-fitting clothing. Do not wear anything tight around your waist that causes pressure on your stomach.  Raise the head of your bed 6 to 8 inches with wood blocks to help you sleep. Extra pillows will not help.  Only take over-the-counter or prescription medicines for pain, discomfort, or fever as directed by your caregiver.  Do not take aspirin, ibuprofen,  or other nonsteroidal anti-inflammatory drugs (NSAIDs). SEEK IMMEDIATE MEDICAL CARE IF:   You have pain in your arms, neck, jaw, teeth, or back.  Your pain increases or changes in intensity or duration.  You develop nausea, vomiting, or sweating (diaphoresis).  You develop shortness of breath, or you faint.  Your vomit is green, yellow, black, or looks like coffee grounds or blood.  Your stool is red, bloody, or black. These symptoms could be signs of other problems, such as heart disease, gastric bleeding, or esophageal bleeding. MAKE SURE YOU:   Understand these instructions.  Will watch your condition.  Will get help right away if you are not doing well or get worse.   Colon  Polyps Polyps are lumps of extra tissue growing inside the body. Polyps can grow in the large intestine (colon). Most colon polyps are noncancerous (benign). However, some colon polyps can become cancerous over time. Polyps that are larger than a pea may be harmful. To be safe, caregivers remove and test all polyps. CAUSES  Polyps form when mutations in the genes cause your cells to grow and divide even though no more tissue is needed. RISK FACTORS There are a number of risk factors that can increase your chances of getting colon polyps. They include:  Being older than 50 years.  Family history of colon polyps or colon cancer.  Long-term colon diseases, such as colitis or Crohn disease.  Being overweight.  Smoking.  Being inactive.  Drinking too much alcohol. SYMPTOMS  Most small polyps do not cause symptoms. If symptoms are present, they may include:  Blood in the stool. The stool may look dark red or black.  Constipation or diarrhea that lasts longer than 1 week. DIAGNOSIS People often do not know they have polyps until their caregiver finds them during a regular checkup. Your caregiver can use 4 tests to check for polyps:  Digital rectal exam. The caregiver wears gloves and feels inside the rectum. This test would find polyps only in the rectum.  Barium enema. The caregiver puts a liquid called barium into your rectum before taking X-rays of your colon. Barium makes your colon look white. Polyps are dark, so they are easy to see in the X-ray pictures.  Sigmoidoscopy. A thin, flexible tube (sigmoidoscope) is placed into your rectum. The sigmoidoscope has a light and tiny camera in it. The caregiver uses the sigmoidoscope to look at the last third of your colon.  Colonoscopy. This test is like sigmoidoscopy, but the caregiver looks at the entire colon. This is the most common method for finding and removing polyps. TREATMENT  Any polyps will be removed during a sigmoidoscopy or  colonoscopy. The polyps are then tested for cancer. PREVENTION  To help lower your risk of getting more colon polyps:  Eat plenty of fruits and vegetables. Avoid eating fatty foods.  Do not smoke.  Avoid drinking alcohol.  Exercise every day.  Lose weight if recommended by your caregiver.  Eat plenty of calcium and folate. Foods that are rich in calcium include milk, cheese, and broccoli. Foods that are rich in folate include chickpeas, kidney beans, and spinach. HOME CARE INSTRUCTIONS Keep all follow-up appointments as directed by your caregiver. You may need periodic exams to check for polyps. SEEK MEDICAL CARE IF: You notice bleeding during a bowel movement.   Diverticulosis Diverticulosis is a common condition that develops when small pouches (diverticula) form in the wall of the colon. The risk of diverticulosis increases with age. It happens  more often in people who eat a low-fiber diet. Most individuals with diverticulosis have no symptoms. Those individuals with symptoms usually experience abdominal pain, constipation, or loose stools (diarrhea). HOME CARE INSTRUCTIONS   Increase the amount of fiber in your diet as directed by your caregiver or dietician. This may reduce symptoms of diverticulosis.  Your caregiver may recommend taking a dietary fiber supplement.  Drink at least 6 to 8 glasses of water each day to prevent constipation.  Try not to strain when you have a bowel movement.  Your caregiver may recommend avoiding nuts and seeds to prevent complications, although this is still an uncertain benefit.  Only take over-the-counter or prescription medicines for pain, discomfort, or fever as directed by your caregiver. FOODS WITH HIGH FIBER CONTENT INCLUDE:  Fruits. Apple, peach, pear, tangerine, raisins, prunes.  Vegetables. Brussels sprouts, asparagus, broccoli, cabbage, carrot, cauliflower, romaine lettuce, spinach, summer squash, tomato, winter squash,  zucchini.  Starchy Vegetables. Baked beans, kidney beans, lima beans, split peas, lentils, potatoes (with skin).  Grains. Whole wheat bread, brown rice, bran flake cereal, plain oatmeal, white rice, shredded wheat, bran muffins. SEEK IMMEDIATE MEDICAL CARE IF:   You develop increasing pain or severe bloating.  You have an oral temperature above 102 F (38.9 C), not controlled by medicine.  You develop vomiting or bowel movements that are bloody or black.

## 2013-09-29 NOTE — Op Note (Signed)
[facility] [address] [place], [address]   ENDOSCOPY PROCEDURE REPORT  PATIENT: Danielle [name], Danielle [name]  MR#: [id number] BIRTHDATE: 03/28/1941 , 74  yrs. old GENDER: Female ENDOSCOPIST: R.  Garfield Cornea, MD FACP FACG REFERRED BY:  Sharilyn Sites, M.D. PROCEDURE DATE:  09/29/2013 PROCEDURE:     EGD with Venia Minks dilation  INDICATIONS:     esophageal dysphagia; GERD  INFORMED CONSENT:   The risks, benefits, limitations, alternatives and imponderables have been discussed.  The potential for biopsy, esophogeal dilation, etc. have also been reviewed.  Questions have been answered.  All parties agreeable.  Please see the history and physical in the medical record for more information.  MEDICATIONS:   Versed 5 mg IV and Demerol 75 mg IV in divided doses. Xylocaine gel orally. Zofran 4 mg IV  DESCRIPTION OF PROCEDURE:   The EY-[id number] ([id number])  endoscope was introduced through the mouth and advanced to the second portion of the duodenum without difficulty or limitations.  The mucosal surfaces were surveyed very carefully during advancement of the scope and upon withdrawal.  Retroflexion view of the proximal stomach and esophagogastric junction was performed.      FINDINGS:   Normal appearing tubular esophagus. Stomach empty. Normal gastric mucosa. Patent pylorus. Normal first and second portion of the duodenum  THERAPEUTIC / DIAGNOSTIC MANEUVERS PERFORMED:  A 54 French Maloney dilator was passed to full insertion easily. A look back revealed no apparent complication related to this maneuver. It did appear that a subtle submucosal ring at the GE junction had been ruptured   COMPLICATIONS:  None  IMPRESSION:  Esophageal ring status post disruption as described above; otherwise normal EGD  RECOMMENDATIONS:   Resume PPI -   either omeprazole 20 mg or pantoprazole 40 mg daily. See colonoscopy report    _______________________________ R. Garfield Cornea, MD FACP  [facility] eSigned:  R. Garfield Cornea, MD FACP [facility] 09/29/2013 12:53 PM     CC:

## 2013-10-01 ENCOUNTER — Encounter: Payer: Self-pay | Admitting: Internal Medicine

## 2013-10-07 ENCOUNTER — Encounter (HOSPITAL_COMMUNITY): Payer: Self-pay | Admitting: Internal Medicine

## 2013-10-07 ENCOUNTER — Encounter (HOSPITAL_COMMUNITY): Payer: Medicare Other | Attending: Hematology and Oncology

## 2013-10-07 DIAGNOSIS — D704 Cyclic neutropenia: Secondary | ICD-10-CM | POA: Insufficient documentation

## 2013-10-07 DIAGNOSIS — J45909 Unspecified asthma, uncomplicated: Secondary | ICD-10-CM | POA: Insufficient documentation

## 2013-10-07 DIAGNOSIS — I10 Essential (primary) hypertension: Secondary | ICD-10-CM | POA: Insufficient documentation

## 2013-10-07 DIAGNOSIS — Q393 Congenital stenosis and stricture of esophagus: Secondary | ICD-10-CM

## 2013-10-07 DIAGNOSIS — D709 Neutropenia, unspecified: Secondary | ICD-10-CM

## 2013-10-07 DIAGNOSIS — Q391 Atresia of esophagus with tracheo-esophageal fistula: Secondary | ICD-10-CM | POA: Insufficient documentation

## 2013-10-07 DIAGNOSIS — H409 Unspecified glaucoma: Secondary | ICD-10-CM | POA: Insufficient documentation

## 2013-10-07 DIAGNOSIS — K219 Gastro-esophageal reflux disease without esophagitis: Secondary | ICD-10-CM | POA: Insufficient documentation

## 2013-10-07 DIAGNOSIS — Z87891 Personal history of nicotine dependence: Secondary | ICD-10-CM | POA: Insufficient documentation

## 2013-10-07 DIAGNOSIS — Z79899 Other long term (current) drug therapy: Secondary | ICD-10-CM | POA: Insufficient documentation

## 2013-10-07 DIAGNOSIS — G4733 Obstructive sleep apnea (adult) (pediatric): Secondary | ICD-10-CM | POA: Insufficient documentation

## 2013-10-07 LAB — CBC WITH DIFFERENTIAL/PLATELET
Basophils Absolute: 0 10*3/uL (ref 0.0–0.1)
Basophils Relative: 0 % (ref 0–1)
Eosinophils Absolute: 0.1 10*3/uL (ref 0.0–0.7)
Eosinophils Relative: 2 % (ref 0–5)
HCT: 34.3 % — ABNORMAL LOW (ref 36.0–46.0)
Hemoglobin: 11.4 g/dL — ABNORMAL LOW (ref 12.0–15.0)
Lymphocytes Relative: 35 % (ref 12–46)
Lymphs Abs: 1.1 10*3/uL (ref 0.7–4.0)
MCH: 29.3 pg (ref 26.0–34.0)
MCHC: 33.2 g/dL (ref 30.0–36.0)
MCV: 88.2 fL (ref 78.0–100.0)
Monocytes Absolute: 0.3 10*3/uL (ref 0.1–1.0)
Monocytes Relative: 9 % (ref 3–12)
Neutro Abs: 1.7 10*3/uL (ref 1.7–7.7)
Neutrophils Relative %: 53 % (ref 43–77)
Platelets: 234 10*3/uL (ref 150–400)
RBC: 3.89 MIL/uL (ref 3.87–5.11)
RDW: 14 % (ref 11.5–15.5)
WBC: 3.1 10*3/uL — ABNORMAL LOW (ref 4.0–10.5)

## 2013-10-08 ENCOUNTER — Encounter (HOSPITAL_COMMUNITY): Payer: Self-pay

## 2013-10-08 ENCOUNTER — Encounter (HOSPITAL_BASED_OUTPATIENT_CLINIC_OR_DEPARTMENT_OTHER): Payer: Medicare Other

## 2013-10-08 VITALS — BP 147/71 | HR 78 | Temp 98.2°F | Resp 20 | Wt 175.3 lb

## 2013-10-08 DIAGNOSIS — G4733 Obstructive sleep apnea (adult) (pediatric): Secondary | ICD-10-CM

## 2013-10-08 DIAGNOSIS — Q393 Congenital stenosis and stricture of esophagus: Secondary | ICD-10-CM

## 2013-10-08 DIAGNOSIS — K222 Esophageal obstruction: Secondary | ICD-10-CM

## 2013-10-08 DIAGNOSIS — D709 Neutropenia, unspecified: Secondary | ICD-10-CM

## 2013-10-08 DIAGNOSIS — Q391 Atresia of esophagus with tracheo-esophageal fistula: Secondary | ICD-10-CM

## 2013-10-08 NOTE — Patient Instructions (Signed)
[facility] Discharge Instructions  RECOMMENDATIONS MADE BY THE CONSULTANT AND ANY TEST RESULTS WILL BE SENT TO YOUR REFERRING PHYSICIAN.  No further appointments need be made in this office. Please call should any new symptoms occur that are troublesome and persistent. 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   Thank you for choosing [facility] to provide your oncology and hematology care.  To afford each patient quality time with our providers, please arrive at least 15 minutes before your scheduled appointment time.  With your help, our goal is to use those 15 minutes to complete the necessary work-up to ensure our physicians have the information they need to help with your evaluation and healthcare recommendations.    Effective January 1st, 2014, we ask that you re-schedule your appointment with our physicians should you arrive 10 or more minutes late for your appointment.  We strive to give you quality time with our providers, and arriving late affects you and other patients whose appointments are after yours.    Again, thank you for choosing [facility].  Our hope is that these requests will decrease the amount of time that you wait before being seen by our physicians.       _____________________________________________________________  Should you have questions after your visit to [facility], please contact our office at [phone] between the hours of 8:30 a.m. and 5:00 p.m.  Voicemails left after 4:30 p.m. will not be returned until the following business day.  For prescription refill requests, have your pharmacy contact our office with your prescription refill request.

## 2013-10-08 NOTE — Progress Notes (Signed)
[facility]  OFFICE PROGRESS NOTE  Purvis Kilts, MD [address] [place] [address]  DIAGNOSIS: Neutropenia - Plan: CBC with Differential  Schatzki's ring  Chief Complaint  Patient presents with  . Chronic cyclic neutropenia    CURRENT THERAPY: Watchful expectation and surveillance  INTERVAL HISTORY: Danielle [name] 73 y.o. female returns for followup of benign cyclic neutropenia in the setting of obstructive sleep apnea syndrome. She was recently evaluated with upper and lower endoscopy on 09/29/2013 at which time Schatzki's ring was found along with a colonic polyp and evidence of diverticulosis. She remains totally asymptomatic. Appetite is good with no nausea, vomiting, dysphagia, on diet dysphagia, diarrhea, constipation, abdominal pain, fever, night sweats, easy satiety, lymphadenopathy, lower extremity swelling or redness, chest pain, PND, orthopnea, palpitations, skin rash, joint pain, headache, or seizures. She underwent colonoscopy as a screening test and had upper endoscopy because she has been on long-term proton pump inhibitor therapy.  MEDICAL HISTORY: Past Medical History  Diagnosis Date  . Hypertension   . Asthma     seasonal; spring/fall  . Arthritis   . Anxiety   . PONV (postoperative nausea and vomiting)   . Glaucoma   . Neutropenia, cyclic     Benign, followed by hematology  . Sleep apnea     CPAP at night  . GERD (gastroesophageal reflux disease)     INTERIM HISTORY: has Neutropenia; Hx of adenomatous colonic polyps; Esophageal dysphagia; and GERD (gastroesophageal reflux disease) on her problem list.    ALLERGIES:  is allergic to trilyte.  MEDICATIONS: has a current medication list which includes the following prescription(s): albuterol, aspirin ec, brimonidine, calcium-vitamin d, fluticasone, hydrochlorothiazide, lansoprazole, multiple vitamins-minerals, NON FORMULARY,  NON FORMULARY, fish oil, potassium, simvastatin, and travoprost (bak free).  SURGICAL HISTORY:  Past Surgical History  Procedure Laterality Date  . Vaginal hysterectomy  75's    [place], partial  . Tonsillectomy  age 54  . Knee arthroscopy  07/19/2011    Procedure: ARTHROSCOPY KNEE;  Surgeon: Sanjuana Kava;  Location: [facility];  Service: Orthopedics;  Laterality: Left;  partial medial menisectomy anterior horn, removal of loose body  . Colonoscopy  08/09/2008      STM:[id number] rectum, few right-sided diverticula, diminutive  polypoid mucosa at the ileocecal valve status post cold biopsy removal Remainder of colonic mucosa appeared normal  . Colonoscopy, esophagogastroduodenoscopy (egd) and esophageal dilation N/A 09/29/2013    Procedure: COLONOSCOPY, ESOPHAGOGASTRODUODENOSCOPY (EGD) AND ESOPHAGEAL DILATION (ED);  Surgeon: Daneil Dolin, MD;  Location: [facility] END[facility] SUITE;  Service: Endoscopy;  Laterality: N/A;  12:15    FAMILY HISTORY: family history includes Cancer in her maternal grandfather, mother, and paternal grandmother; Clotting disorder in her brother; Diabetes in her brother. There is no history of Hypotension, Malignant hyperthermia, Pseudochol deficiency, or Colon cancer.  SOCIAL HISTORY:  reports that she quit smoking about 35 years ago. Her smoking use included Cigarettes. She has a 2.125 pack-year smoking history. She has never used smokeless tobacco. She reports that she drinks alcohol. She reports that she does not use illicit drugs.  REVIEW OF SYSTEMS:  Other than that discussed above is noncontributory.  PHYSICAL EXAMINATION: ECOG PERFORMANCE STATUS: 0 - Asymptomatic  Blood pressure 147/71, pulse 78, temperature 98.2 F (36.8 C), temperature source Oral, resp. rate 20, weight 175 lb 4.8 oz (79.516 kg), SpO2 100.00%.  GENERAL:alert, no distress and comfortable SKIN: skin color, texture, turgor are  normal, no rashes or significant lesions EYES: PERLA; Conjunctiva are pink and  non-injected, sclera clear SINUSES: No redness or tenderness over maxillary or ethmoid sinuses OROPHARYNX:no exudate, no erythema on lips, buccal mucosa, or tongue. NECK: supple, thyroid normal size, non-tender, without nodularity. No masses CHEST: Normal AP diameter with no breast masses. LYMPH:  no palpable lymphadenopathy in the cervical, axillary or inguinal LUNGS: clear to auscultation and percussion with normal breathing effort HEART: regular rate & rhythm and no murmurs. ABDOMEN:abdomen soft, non-tender and normal bowel sounds MUSCULOSKELETAL:no cyanosis of digits and no clubbing. Range of motion normal.  NEURO: alert & oriented x 3 with fluent speech, no focal motor/sensory deficits   LABORATORY DATA: Appointment on 10/07/2013  Component Date Value Ref Range Status  . WBC 10/07/2013 3.1* 4.0 - 10.5 K/uL Final  . RBC 10/07/2013 3.89  3.87 - 5.11 MIL/uL Final  . Hemoglobin 10/07/2013 11.4* 12.0 - 15.0 g/dL Final  . HCT 10/07/2013 34.3* 36.0 - 46.0 % Final  . MCV 10/07/2013 88.2  78.0 - 100.0 fL Final  . MCH 10/07/2013 29.3  26.0 - 34.0 pg Final  . MCHC 10/07/2013 33.2  30.0 - 36.0 g/dL Final  . RDW 10/07/2013 14.0  11.5 - 15.5 % Final  . Platelets 10/07/2013 234  150 - 400 K/uL Final  . Neutrophils Relative % 10/07/2013 53  43 - 77 % Final  . Neutro Abs 10/07/2013 1.7  1.7 - 7.7 K/uL Final  . Lymphocytes Relative 10/07/2013 35  12 - 46 % Final  . Lymphs Abs 10/07/2013 1.1  0.7 - 4.0 K/uL Final  . Monocytes Relative 10/07/2013 9  3 - 12 % Final  . Monocytes Absolute 10/07/2013 0.3  0.1 - 1.0 K/uL Final  . Eosinophils Relative 10/07/2013 2  0 - 5 % Final  . Eosinophils Absolute 10/07/2013 0.1  0.0 - 0.7 K/uL Final  . Basophils Relative 10/07/2013 0  0 - 1 % Final  . Basophils Absolute 10/07/2013 0.0  0.0 - 0.1 K/uL Final    PATHOLOGY: Peripheral smear failed to reveal evidence of premature forms, basophilic stippling, or hypersegmented neutrophils.  Urinalysis      Component Value Date/Time   COLORURINE YELLOW 07/16/2011 0930   APPEARANCEUR CLEAR 07/16/2011 0930   LABSPEC 1.020 07/16/2011 0930   PHURINE 7.0 07/16/2011 0930   GLUCOSEU NEGATIVE 07/16/2011 0930   HGBUR SMALL* 07/16/2011 0930   BILIRUBINUR NEGATIVE 07/16/2011 0930   KETONESUR NEGATIVE 07/16/2011 0930   PROTEINUR NEGATIVE 07/16/2011 0930   UROBILINOGEN 0.2 07/16/2011 0930   NITRITE NEGATIVE 07/16/2011 0930   LEUKOCYTESUR TRACE* 07/16/2011 0930    RADIOGRAPHIC STUDIES: No results found.  ASSESSMENT:  #1. Benign cyclic neutropenia of no clinical significance. #2. Obstructive sleep apnea syndrome, on CPAP. #3. Glaucoma, on treatment. #4. Schatzki's ring, status post EGD with biopsy, on chronic proton pump inhibitor therapy.   PLAN:  #1. No further appointments need be made in this office. She was told to call should any new symptoms occur that are troublesome and persistent.   All questions were answered. The patient knows to call the clinic with any problems, questions or concerns. We can certainly see the patient much sooner if necessary.   I spent 25 minutes counseling the patient face to face. The total time spent in the appointment was 30 minutes.    Doroteo Bradford, MD 10/08/2013 10:44 AM

## 2013-10-09 ENCOUNTER — Ambulatory Visit (HOSPITAL_COMMUNITY): Payer: Medicare Other

## 2013-10-09 NOTE — Progress Notes (Signed)
Labs drawn via PIV stick.  

## 2013-11-06 ENCOUNTER — Other Ambulatory Visit: Payer: Self-pay | Admitting: Cardiology

## 2013-11-06 NOTE — Telephone Encounter (Signed)
Rx refill sent to patient pharmacy   

## 2013-11-10 ENCOUNTER — Encounter: Payer: Self-pay | Admitting: *Deleted

## 2013-11-12 ENCOUNTER — Encounter: Payer: Self-pay | Admitting: Cardiology

## 2013-11-12 ENCOUNTER — Ambulatory Visit (INDEPENDENT_AMBULATORY_CARE_PROVIDER_SITE_OTHER): Payer: Medicare Other | Admitting: Cardiology

## 2013-11-12 VITALS — BP 110/68 | HR 62 | Ht 66.0 in | Wt 172.9 lb

## 2013-11-12 DIAGNOSIS — E785 Hyperlipidemia, unspecified: Secondary | ICD-10-CM | POA: Insufficient documentation

## 2013-11-12 DIAGNOSIS — I10 Essential (primary) hypertension: Secondary | ICD-10-CM | POA: Insufficient documentation

## 2013-11-12 MED ORDER — SIMVASTATIN 40 MG PO TABS: 40.0000 mg | ORAL_TABLET | Freq: Every day | ORAL | Status: DC

## 2013-11-12 MED ORDER — HYDROCHLOROTHIAZIDE 25 MG PO TABS: ORAL_TABLET | ORAL | Status: DC

## 2013-11-12 NOTE — Progress Notes (Signed)
Patient ID: Danielle [name], female   DOB: 09-Feb-1941, 73 y.o.   MRN: [id number]  11/12/2013  Primary Physicia Purvis Kilts, MD Primary Cardiologist: Glenetta Hew, MD  HPI:   Ms. Haggar is a very pleasant 73 year old F with a history of hypertension and hyperlipidemia who presents for annual evaluation. She denies any chest pain or shortness of breath in the last year. She is very active and exercises almost daily. She denies swelling of extremities, PND, orthopnea.  No palpitations, lightheadedness, dizziness, weakness,  syncope/near syncope, or TIA//amaurosis fugax symptoms. She recently had a colonoscopy in March 2015 that demonstrated one benign polyp, but no melena or hematochezia. She is due to routine labs at the office of her PCP, Eddie Candle, MD, in June.   Past Cardiac Evaluation:  - Echo 2003, normal EF with LVH - Treadmill myoview 2007, no ischemia or infarction, EF of 75%  PMH Reviewed in Epic.  Current Outpatient Prescriptions  Medication Sig Dispense Refill  . albuterol (PROVENTIL HFA;VENTOLIN HFA) 108 (90 BASE) MCG/ACT inhaler Inhale 2 puffs into the lungs every 6 (six) hours as needed. For shortness of breath       . aspirin EC 81 MG tablet Take 81 mg by mouth daily.        . brimonidine (ALPHAGAN) 0.15 % ophthalmic solution Place 1 drop into both eyes 2 (two) times daily.        . Calcium-Vitamin D (CALTRATE 600 PLUS-VIT D PO) Take 1 tablet by mouth daily.        . fluticasone (FLOVENT HFA) 110 MCG/ACT inhaler Inhale 1 puff into the lungs as needed. For shortness of breath       . hydrochlorothiazide (HYDRODIURIL) 25 MG tablet TAKE 1 TABLET BY MOUTH EVERY DAY  90 tablet  3  . lansoprazole (PREVACID) 15 MG capsule Take 30 mg by mouth daily. For indigestion      . Multiple Vitamins-Minerals (CENTRUM SILVER PO) Take 1 capsule by mouth daily.      . NON FORMULARY Aloe vera juice      . NON FORMULARY Uses cpap at night      . Omega-3 Fatty Acids (FISH OIL) 1000 MG  CAPS Take 1 capsule by mouth daily.        . Potassium 99 MG TABS Take 1 tablet by mouth as needed. Takes infrequently with no particular schedule      . simvastatin (ZOCOR) 40 MG tablet Take 1 tablet (40 mg total) by mouth at bedtime.  90 tablet  3  . Travoprost, BAK Free, (TRAVATAN) 0.004 % SOLN ophthalmic solution Place 1 drop into both eyes at bedtime.         No current facility-administered medications for this visit.    Allergies  Allergen Reactions  . Trilyte [Peg 3350-Kcl-Na Bicarb-Nacl] Swelling    Swelling of tongue    History   Social History  . Marital Status: Married    Spouse Name: N/A    Number of Children: 1  . Years of Education: 16   Occupational History  . Retired Tourist information centre manager    Social History Main Topics  . Smoking status: Former Smoker -- 0.25 packs/day for 8.5 years    Types: Cigarettes    Quit date: 07/15/1978  . Smokeless tobacco: Never Used  . Alcohol Use: Yes     Comment: occasional  . Drug Use: No  . Sexual Activity: Not on file   Other Topics Concern  . Not on  file   Social History Narrative  . No narrative on file     Review of Systems: General: negative for chills, fever, night sweats or weight changes.  Cardiovascular: negative for chest pain, dyspnea on exertion, edema, orthopnea, palpitations, paroxysmal nocturnal dyspnea or shortness of breath Dermatological: negative for rash Respiratory: negative for cough or wheezing Urologic: negative for hematuria Abdominal: negative for nausea, vomiting, diarrhea, bright red blood per rectum, melena, or hematemesis Neurologic: negative for visual changes, syncope, or dizziness All other systems reviewed and are otherwise negative except as noted above.   Blood pressure 110/68, pulse 62, height 5\' 6"  (1.676 m), weight 172 lb 14.4 oz (78.427 kg).  General appearance: alert, cooperative and youthful appearing AAF Neck: no adenopathy, no carotid bruit, no JVD, supple, symmetrical, trachea  midline and thyroid not enlarged, symmetric, no tenderness/mass/nodules Lungs: clear to auscultation bilaterally Heart: regular rate and rhythm, S1, S2 normal, no murmur, click, rub or gallop Extremities: extremities normal, atraumatic, no cyanosis or edema  EKG normal sinus rhythm, rate 62, LAD, poor R wave progression  ASSESSMENT AND PLAN:   Essential hypertension A: well contorlled P: continue HCTZ 25 mg po daily  Hyperlipidemia A: stable P: f/u with PCP, no change to simvastatin at this time    Talmadge Coventry, [facility] 11/12/2013 3:42 PM   I have seen and evaluated the patient this afternoon along with Dr. Maricela Bo. I agree with his findings, examination as well as impression recommendations.  Overall stable hypertension and dyslipidemia. We'll continue current regimen. She is monitored closely by her PCP. She does continue to follow on an annual basis.  MD Time with pt: 15 min  Leonie Man, M.D., M.S. Interventional Cardiologist  [place] Pager # [phone] 11/13/2013

## 2013-11-12 NOTE — Assessment & Plan Note (Signed)
A: well contorlled P: continue HCTZ 25 mg po daily

## 2013-11-12 NOTE — Patient Instructions (Signed)
Continue current medication  Your physician wants you to follow-up in 12 month Dr Harding.  You will receive a reminder letter in the mail two months in advance. If you don't receive a letter, please call our office to schedule the follow-up appointment.  

## 2013-11-12 NOTE — Assessment & Plan Note (Signed)
A: stable P: f/u with PCP, no change to simvastatin at this time

## 2013-11-13 NOTE — Progress Notes (Signed)
Patient seen and examined along with Dr. Maricela Bo. I agree with his findings, examination, impression recommendations.  See my addendum to his note.  Leonie Man, MD

## 2013-12-11 ENCOUNTER — Other Ambulatory Visit: Payer: Self-pay | Admitting: Cardiology

## 2014-03-16 ENCOUNTER — Other Ambulatory Visit: Payer: Self-pay | Admitting: Internal Medicine

## 2014-05-05 ENCOUNTER — Other Ambulatory Visit (HOSPITAL_COMMUNITY): Payer: Self-pay | Admitting: Urology

## 2014-05-05 ENCOUNTER — Ambulatory Visit (HOSPITAL_COMMUNITY)
Admission: RE | Admit: 2014-05-05 | Discharge: 2014-05-05 | Disposition: A | Discharge status: Home / Self Care | Payer: Medicare Other | Source: Ambulatory Visit | Attending: Urology | Admitting: Urology

## 2014-05-05 DIAGNOSIS — R312 Other microscopic hematuria: Secondary | ICD-10-CM | POA: Insufficient documentation

## 2014-05-05 DIAGNOSIS — N200 Calculus of kidney: Secondary | ICD-10-CM | POA: Diagnosis present

## 2014-05-05 DIAGNOSIS — R3129 Other microscopic hematuria: Secondary | ICD-10-CM

## 2014-05-06 ENCOUNTER — Ambulatory Visit (HOSPITAL_COMMUNITY)
Admission: RE | Admit: 2014-05-06 | Discharge: 2014-05-06 | Disposition: A | Discharge status: Home / Self Care | Payer: Medicare Other | Source: Ambulatory Visit | Attending: Urology | Admitting: Urology

## 2014-05-06 DIAGNOSIS — Z87442 Personal history of urinary calculi: Secondary | ICD-10-CM | POA: Insufficient documentation

## 2014-05-06 DIAGNOSIS — R312 Other microscopic hematuria: Secondary | ICD-10-CM | POA: Diagnosis not present

## 2014-05-06 DIAGNOSIS — R3129 Other microscopic hematuria: Secondary | ICD-10-CM

## 2014-05-06 DIAGNOSIS — N200 Calculus of kidney: Secondary | ICD-10-CM

## 2014-10-28 ENCOUNTER — Encounter: Payer: Medicare Other | Attending: Family Medicine

## 2014-10-28 VITALS — Ht 64.0 in | Wt 175.1 lb

## 2014-10-28 DIAGNOSIS — Z713 Dietary counseling and surveillance: Secondary | ICD-10-CM | POA: Insufficient documentation

## 2014-10-28 DIAGNOSIS — E119 Type 2 diabetes mellitus without complications: Secondary | ICD-10-CM | POA: Diagnosis present

## 2014-10-28 NOTE — Progress Notes (Signed)
Patient was seen on 10/28/14 for the first of a series of three diabetes self-management courses at the [facility].  Patient Education Plan per assessed needs and concerns is to attend four course education program for Diabetes Self Management Education.  The following learning objectives were met by the patient during this class:  Describe diabetes  State some common risk factors for diabetes  Defines the role of glucose and insulin  Identifies type of diabetes and pathophysiology  Describe the relationship between diabetes and cardiovascular risk  State the members of the Healthcare Team  States the rationale for glucose monitoring  State when to test glucose  State their individual Target Range  State the importance of logging glucose readings  Describe how to interpret glucose readings  Identifies A1C target  Explain the correlation between A1c and eAG values  State symptoms and treatment of high blood glucose  State symptoms and treatment of low blood glucose  Explain proper technique for glucose testing  Identifies proper sharps disposal  Handouts given during class include:  Living Well with Diabetes book  Carb Counting and Meal Planning book  Meal Plan Card  Carbohydrate guide  Meal planning worksheet  Low Sodium Flavoring Tips  The diabetes portion plate  N2Q to eAG Conversion Chart  Diabetes Medications  Diabetes Recommended Care Schedule  Support Group  Diabetes Success Plan  Core Class Satisfaction Survey  Follow-Up Plan:  Attend core 2

## 2014-10-29 ENCOUNTER — Other Ambulatory Visit (HOSPITAL_COMMUNITY): Payer: Self-pay

## 2014-10-29 DIAGNOSIS — Z1231 Encounter for screening mammogram for malignant neoplasm of breast: Secondary | ICD-10-CM

## 2014-11-03 ENCOUNTER — Ambulatory Visit (HOSPITAL_COMMUNITY)
Admission: RE | Admit: 2014-11-03 | Discharge: 2014-11-03 | Disposition: A | Discharge status: Home / Self Care | Payer: Medicare Other | Source: Ambulatory Visit | Attending: Family Medicine | Admitting: Family Medicine

## 2014-11-03 ENCOUNTER — Other Ambulatory Visit (HOSPITAL_COMMUNITY): Payer: Self-pay

## 2014-11-03 DIAGNOSIS — Z1231 Encounter for screening mammogram for malignant neoplasm of breast: Secondary | ICD-10-CM | POA: Insufficient documentation

## 2014-11-04 DIAGNOSIS — I10 Essential (primary) hypertension: Secondary | ICD-10-CM

## 2014-11-04 DIAGNOSIS — E119 Type 2 diabetes mellitus without complications: Secondary | ICD-10-CM | POA: Diagnosis not present

## 2014-11-04 DIAGNOSIS — Z713 Dietary counseling and surveillance: Secondary | ICD-10-CM

## 2014-11-04 NOTE — Progress Notes (Signed)

## 2014-11-11 ENCOUNTER — Encounter: Payer: Medicare Other | Attending: Family Medicine

## 2014-11-11 DIAGNOSIS — Z713 Dietary counseling and surveillance: Secondary | ICD-10-CM | POA: Diagnosis not present

## 2014-11-11 DIAGNOSIS — E119 Type 2 diabetes mellitus without complications: Secondary | ICD-10-CM | POA: Insufficient documentation

## 2014-11-15 ENCOUNTER — Encounter: Payer: Self-pay | Admitting: Cardiology

## 2014-11-15 ENCOUNTER — Ambulatory Visit (INDEPENDENT_AMBULATORY_CARE_PROVIDER_SITE_OTHER): Payer: Medicare Other | Admitting: Cardiology

## 2014-11-15 VITALS — BP 100/62 | HR 61 | Ht 64.0 in | Wt 175.5 lb

## 2014-11-15 DIAGNOSIS — I10 Essential (primary) hypertension: Secondary | ICD-10-CM | POA: Diagnosis not present

## 2014-11-15 DIAGNOSIS — Z0000 Encounter for general adult medical examination without abnormal findings: Secondary | ICD-10-CM

## 2014-11-15 DIAGNOSIS — E785 Hyperlipidemia, unspecified: Secondary | ICD-10-CM | POA: Diagnosis not present

## 2014-11-15 NOTE — Progress Notes (Signed)
PCP: Purvis Kilts, MD  Clinic Note: Chief Complaint  Patient presents with  . Follow-up    pt states no chest pain, swelling, dizziness, SOB  . Annual Exam    HPI: Danielle [name] is a 74 y.o. female with a PMH below who presents today for annual followup of her cardiac risk factors including hypertension, hyperlipidemia, and borderline obesity. I saw her in May of 2015. She was doing well at that time without any complaints. She has had an echocardiogram and Myoview were normal. See past surgical history.   Past Medical History  Diagnosis Date  . Hypertension   . Asthma     seasonal; spring/fall  . Arthritis   . Anxiety   . PONV (postoperative nausea and vomiting)   . Glaucoma   . Neutropenia, cyclic     Benign, followed by hematology  . Sleep apnea     CPAP at night  . GERD (gastroesophageal reflux disease)   . Dyslipidemia   . Diabetes mellitus without complication     Prior Cardiac Evaluation and Past Surgical History: Past Surgical History  Procedure Laterality Date  . Vaginal hysterectomy  1985    [place], partial  . Tonsillectomy  1972  . Knee arthroscopy  07/19/2011    Procedure: ARTHROSCOPY KNEE;  Surgeon: Sanjuana Kava;  Location: [facility];  Service: Orthopedics;  Laterality: Left;  partial medial menisectomy anterior horn, removal of loose body  . Colonoscopy  08/09/2008      JXB:JYNWGN rectum, few right-sided diverticula, diminutive  polypoid mucosa at the ileocecal valve status post cold biopsy removal Remainder of colonic mucosa appeared normal  . Colonoscopy, esophagogastroduodenoscopy (egd) and esophageal dilation N/A 09/29/2013    Procedure: COLONOSCOPY, ESOPHAGOGASTRODUODENOSCOPY (EGD) AND ESOPHAGEAL DILATION (ED);  Surgeon: Daneil Dolin, MD;  Location: [facility];  Service: Endoscopy;  Laterality: N/A;  12:15  . Transthoracic echocardiogram  2003    EF normal, moderate asymmetric LVH; AV tri-leaflet, mild aortic sclerosis, mild mitral  annular calcification, mild MR, mild TR  . Nm myocar perf wall motion  05/2006    bruce myoview - normal perfusion, EF 75%, low risk    Interval History: Doing well.  Exercises sometimes - not as well as she was last year.  Watches her diet closely - and is trying to lose weight because she was told that she had prediabetes. She is going to classes and counseling for nutrition and exercise. From a cardiac standpoint she is very stable. Her cardiovascular review of symptoms is as follows:  No chest pain or shortness of breath with rest or exertion.   No PND, orthopnea or edema. No palpitations, lightheadedness, dizziness, weakness or syncope/near syncope.  No TIA/amaurosis fugax symptoms.  No melena, hematochezia, hematuria, or epstaxis.  No claudication.  ROS: A comprehensive was performed. Review of Systems  Constitutional: Negative for malaise/fatigue.  Respiratory: Negative for shortness of breath.   Cardiovascular: Negative.        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 Also hands).  Neurological: Negative for dizziness and headaches.  Endo/Heme/Allergies: Does not bruise/bleed easily.  Psychiatric/Behavioral: Negative for depression.  All other systems reviewed and are negative.   Current Outpatient Prescriptions on File Prior to Visit  Medication Sig Dispense Refill  . albuterol (PROVENTIL HFA;VENTOLIN HFA) 108 (90 BASE) MCG/ACT inhaler Inhale 2 puffs into the lungs every 6 (six) hours as needed. For shortness of breath     .  brimonidine (ALPHAGAN) 0.15 % ophthalmic solution Place 1 drop into both eyes 2 (two) times daily.      . Calcium-Vitamin D (CALTRATE 600 PLUS-VIT D PO) Take 1 tablet by mouth daily.      . fluticasone (FLOVENT HFA) 110 MCG/ACT inhaler Inhale 1 puff into the lungs as needed. For shortness of breath     . hydrochlorothiazide (HYDRODIURIL) 25 MG tablet TAKE 1 TABLET BY MOUTH  EVERY DAY 90 tablet 3  . lansoprazole (PREVACID) 30 MG capsule TAKE 1 CAPSULE BY MOUTH EVERY DAY 30 capsule 11  . Multiple Vitamins-Minerals (CENTRUM SILVER PO) Take 1 capsule by mouth daily.    . NON FORMULARY Aloe vera juice    . NON FORMULARY Uses cpap at night    . Potassium 99 MG TABS Take 1 tablet by mouth as needed. Takes infrequently with no particular schedule    . simvastatin (ZOCOR) 40 MG tablet Take 1 tablet (40 mg total) by mouth at bedtime. 90 tablet 3  . Travoprost, BAK Free, (TRAVATAN) 0.004 % SOLN ophthalmic solution Place 1 drop into both eyes at bedtime.       No current facility-administered medications on file prior to visit.    Allergies  Allergen Reactions  . Trilyte [Peg 3350-Kcl-Na Bicarb-Nacl] Swelling    Swelling of tongue     History  Substance Use Topics  . Smoking status: Former Smoker -- 0.25 packs/day for 8.5 years    Types: Cigarettes    Quit date: 07/15/1978  . Smokeless tobacco: Never Used  . Alcohol Use: Yes     Comment: occasional   Family History  Problem Relation Age of Onset  . Hypotension Neg Hx   . Malignant hyperthermia Neg Hx   . Pseudochol deficiency Neg Hx   . Leukemia Mother   . Hyperlipidemia Mother   . Prostate cancer Maternal Grandfather   . Kidney failure Paternal Grandmother   . Gastric cancer Paternal Grandmother   . Colon cancer Neg Hx   . Diabetes Brother   . Heart disease Father   . Clotting disorder Brother     blood clots  . Heart disease Paternal Grandfather     Wt Readings from Last 3 Encounters:  11/15/14 79.606 kg (175 lb 8 oz)  10/28/14 79.425 kg (175 lb 1.6 oz)  11/12/13 78.427 kg (172 lb 14.4 oz)    PHYSICAL EXAM BP 100/62 mmHg  Pulse 61  Ht 5\' 4"  (1.626 m)  Wt 79.606 kg (175 lb 8 oz)  BMI 30.11 kg/m2 General appearance: alert, cooperative, appears stated age, no distress and mildly obese Neck: no adenopathy, no carotid bruit and no JVD Lungs: clear to auscultation bilaterally, normal  percussion bilaterally and non-labored Heart: regular rate and rhythm, S1& S2 normal, no murmur, click, rub or gallop ;nondisplaced PMI Abdomen: soft, non-tender; bowel sounds normal; no masses,  no organomegaly;  Extremities: extremities normal, atraumatic, no cyanosis, and edema  Pulses: 2+ and symmetric;  Skin: mobility and turgor normal Neurologic: Mental status: Alert, oriented, thought content appropriate Psych: Normal mood and affect. Very pleasant Cranial nerves: normal (II-XII grossly intact)    Adult ECG Report  Rate: 61 ;  Rhythm: normal sinus rhythm and Left axis deviation (-33)  Narrative Interpretation: essentially normal EKG.  Recent Labs:  09/09/2014, from PCP   Na+ 141, K+ 4.3, Cl- 101, HCO3- 25 , BUN 11, Cr  0.75,  Ca2+  9.8; AST 23, ALT 23 AlkP 62, Alb 43, TP 6.4, T Bili 0.3  CBC: W 3.4, H/H 12.1/36.1, Plt 275.  TC 176, TG 55, HDL 84, LDL 81   ASSESSMENT / PLAN: Problem List Items Addressed This Visit    Encounter for annual physical exam   Relevant Orders   EKG 12-Lead (Completed)   Essential hypertension - Primary    Well-controlled. Continue HCTZ.      Relevant Medications   aspirin 81 MG chewable tablet   Other Relevant Orders   EKG 12-Lead (Completed)   Hyperlipidemia    Stable. Reviewed. She is at goal HDL and LDL Continue pravastatin and fish oil Labs monitored by PCP      Relevant Medications   aspirin 81 MG chewable tablet   Other Relevant Orders   EKG 12-Lead (Completed)      Orders Placed This Encounter  Procedures  . EKG 12-Lead   Meds ordered this encounter  Medications  . vitamin C (ASCORBIC ACID) 500 MG tablet    Sig: Take 1 tablet by mouth daily.  Marland Kitchen aspirin 81 MG chewable tablet    Sig: Chew 1 tablet by mouth daily.  . Omega-3 Fatty Acids (FISH OIL) 1000 MG CAPS    Sig: Take 1 capsule by mouth daily.     Followup: one year    Alazne Quant, Leonie Green, M.D., M.S. Interventional Cardiologist   Pager #  [phone]

## 2014-11-15 NOTE — Patient Instructions (Signed)
NO CHANGE IN CURRENT MEDICATIONS  Your physician wants you to follow-up in [place].  You will receive a reminder letter in the mail two months in advance. If you don't receive a letter, please call our office to schedule the follow-up appointment.

## 2014-11-15 NOTE — Progress Notes (Signed)
Patient was seen on 11/11/14 for the third of a series of three diabetes self-management courses at the [facility].   Catalina Gravel the amount of activity recommended for healthy living . Describe activities suitable for individual needs . Identify ways to regularly incorporate activity into daily life . Identify barriers to activity and ways to over come these barriers  Identify diabetes medications being personally used and their primary action for lowering glucose and possible side effects . Describe role of stress on blood glucose and develop strategies to address psychosocial issues . Identify diabetes complications and ways to prevent them  Explain how to manage diabetes during illness . Evaluate success in meeting personal goal . Establish 2-3 goals that they will plan to diligently work on until they return for the  2-month follow-up visit  Goals:   I will count my carb choices at most meals and snacks  I will be active more  a week   Your patient has identified these potential barriers to change:  Motivation   Your patient has identified their diabetes self-care support plan as  Family Support  Plan:  Attend Core 4 in 4 months

## 2014-11-17 ENCOUNTER — Encounter: Payer: Self-pay | Admitting: Cardiology

## 2014-11-17 DIAGNOSIS — Z0000 Encounter for general adult medical examination without abnormal findings: Secondary | ICD-10-CM | POA: Insufficient documentation

## 2014-11-17 NOTE — Assessment & Plan Note (Signed)
Stable. Reviewed. She is at goal HDL and LDL Continue pravastatin and fish oil Labs monitored by PCP

## 2014-11-17 NOTE — Assessment & Plan Note (Signed)
Well-controlled. Continue HCTZ

## 2014-11-18 ENCOUNTER — Encounter: Payer: Self-pay | Admitting: Cardiology

## 2014-12-06 ENCOUNTER — Other Ambulatory Visit: Payer: Self-pay | Admitting: Cardiology

## 2014-12-07 NOTE — Telephone Encounter (Signed)
Rx(s) sent to pharmacy electronically.  

## 2015-02-08 ENCOUNTER — Other Ambulatory Visit: Payer: Self-pay | Admitting: Cardiology

## 2015-02-08 NOTE — Telephone Encounter (Signed)
Rx(s) sent to pharmacy electronically.  

## 2015-02-14 ENCOUNTER — Encounter: Payer: Self-pay | Admitting: Cardiovascular Disease

## 2015-02-14 ENCOUNTER — Encounter: Payer: Self-pay | Admitting: *Deleted

## 2015-03-02 ENCOUNTER — Ambulatory Visit: Payer: Medicare Other

## 2015-03-17 ENCOUNTER — Other Ambulatory Visit: Payer: Self-pay | Admitting: Gastroenterology

## 2015-08-29 ENCOUNTER — Telehealth: Payer: Self-pay | Admitting: Cardiology

## 2015-08-29 NOTE — Telephone Encounter (Signed)
New message   Pt is not having any issues not a week ago she was having flutters

## 2015-08-29 NOTE — Telephone Encounter (Signed)
Pt called in as she has been experiencing fluttering feeling in her chest and occasional "chest discomfort".  It last a few minutes then goes a way and she stated it is nothing keeping her up at night it usually only happens during the day, and comes after various times of day. When it comes she sometimes has minimal SOB and gets very tired and sluggish. No c/o of Chest pain, or pressure. Pt offered an appt with NP/PA and stated she only wanted to see the Dr. Abbott [name] given next available appt with Dr. Ellyn [name] on 3/1 @ 9:30am. Pt appreciative, no additional questions at this time.

## 2015-09-01 ENCOUNTER — Encounter (HOSPITAL_COMMUNITY): Payer: Self-pay | Admitting: Emergency Medicine

## 2015-09-01 ENCOUNTER — Emergency Department (HOSPITAL_COMMUNITY): Payer: Medicare Other

## 2015-09-01 ENCOUNTER — Emergency Department (HOSPITAL_COMMUNITY)
Admission: EM | Admit: 2015-09-01 | Discharge: 2015-09-01 | Disposition: A | Discharge status: Home / Self Care | Payer: Medicare Other | Attending: Emergency Medicine | Admitting: Emergency Medicine

## 2015-09-01 DIAGNOSIS — Z79899 Other long term (current) drug therapy: Secondary | ICD-10-CM | POA: Diagnosis not present

## 2015-09-01 DIAGNOSIS — E785 Hyperlipidemia, unspecified: Secondary | ICD-10-CM | POA: Insufficient documentation

## 2015-09-01 DIAGNOSIS — Z7951 Long term (current) use of inhaled steroids: Secondary | ICD-10-CM | POA: Insufficient documentation

## 2015-09-01 DIAGNOSIS — I10 Essential (primary) hypertension: Secondary | ICD-10-CM | POA: Diagnosis not present

## 2015-09-01 DIAGNOSIS — Z87891 Personal history of nicotine dependence: Secondary | ICD-10-CM | POA: Insufficient documentation

## 2015-09-01 DIAGNOSIS — Z7982 Long term (current) use of aspirin: Secondary | ICD-10-CM | POA: Insufficient documentation

## 2015-09-01 DIAGNOSIS — J45909 Unspecified asthma, uncomplicated: Secondary | ICD-10-CM | POA: Insufficient documentation

## 2015-09-01 DIAGNOSIS — Z888 Allergy status to other drugs, medicaments and biological substances status: Secondary | ICD-10-CM | POA: Insufficient documentation

## 2015-09-01 DIAGNOSIS — E119 Type 2 diabetes mellitus without complications: Secondary | ICD-10-CM | POA: Diagnosis not present

## 2015-09-01 DIAGNOSIS — R079 Chest pain, unspecified: Secondary | ICD-10-CM | POA: Diagnosis present

## 2015-09-01 HISTORY — DX: Thrombocytopenia, unspecified: D69.6

## 2015-09-01 LAB — BASIC METABOLIC PANEL
ANION GAP: 10 (ref 5–15)
BUN: 18 mg/dL (ref 6–20)
CALCIUM: 9.9 mg/dL (ref 8.9–10.3)
CO2: 31 mmol/L (ref 22–32)
Chloride: 99 mmol/L — ABNORMAL LOW (ref 101–111)
Creatinine, Ser: 0.92 mg/dL (ref 0.44–1.00)
GFR calc Af Amer: >60 mL/min (ref >60)
GFR calc non Af Amer: >60 mL/min (ref >60)
GLUCOSE: 105 mg/dL — AB (ref 65–99)
Potassium: 3.5 mmol/L (ref 3.5–5.1)
Sodium: 140 mmol/L (ref 135–145)

## 2015-09-01 LAB — CBC
HEMATOCRIT: 40.6 % (ref 36.0–46.0)
HEMOGLOBIN: 13.5 g/dL (ref 12.0–15.0)
MCH: 29.7 pg (ref 26.0–34.0)
MCHC: 33.3 g/dL (ref 30.0–36.0)
MCV: 89.2 fL (ref 78.0–100.0)
Platelets: 264 10*3/uL (ref 150–400)
RBC: 4.55 MIL/uL (ref 3.87–5.11)
RDW: 14.1 % (ref 11.5–15.5)
WBC: 5.3 10*3/uL (ref 4.0–10.5)

## 2015-09-01 LAB — TROPONIN I

## 2015-09-01 NOTE — ED Provider Notes (Signed)
CSN: [id number]     Arrival date & time 09/01/15  1818 History  By signing my name below, I, Danielle [name], attest that this documentation has been prepared under the direction and in the presence of [name], MD . Electronically Signed: [name], Scribe. 09/01/2015. 9:53 PM.    Chief Complaint  Patient presents with  . Chest Pain    Patient is a 75 y.o. female presenting with chest pain. The history is provided by the patient. No language interpreter was used.  Chest Pain Pain location:  Substernal area Pain quality: not sharp   Pain radiates to:  L shoulder and R shoulder Pain radiates to the back: no   Pain severity:  Severe Duration:  3 weeks Timing:  Intermittent Progression:  Worsening Chronicity:  New Relieved by:  None tried Worsened by:  Nothing tried Ineffective treatments:  None tried Associated symptoms: dizziness, fatigue and shortness of breath (intermittent)   Associated symptoms: no abdominal pain, no back pain, no cough, no diaphoresis, no fever, no headache, no nausea and not vomiting   Fatigue:    Severity:  Moderate   Duration:  3 weeks   Timing:  Intermittent   Progression:  Unable to specify Shortness of breath:    Severity:  Mild   Onset quality:  Sudden   Duration:  3 weeks   Timing:  Intermittent   Progression:  Unable to specify    HPI Comments: Danielle [name] is a 75 y.o. female with h/o GERD and HTN who presents to the Emergency Department complaining of gradual onset, intermittent, worsening, substernal chest discomfort for the last three weeks. Pt states that her chest discomfort felt worse tonight; she denies any severe, sharp pain, just constant discomfort in her chest. Pt states that the discomfort will last for 5-10 minutes and will then disappear; she reports that she feels severely fatigued and dizzy after pain presents. Pt states that her pain will occasionally radiate into her bilateral shoulders. She also notes associated  fatigue and dizziness. She reports that she experiences intermittent SOB. Pt has an appointment on March 1st at a heart care center in [place]; she had similar symptoms years ago and was referred to the heart care center. She denies diaphoresis, nausea, vomiting, diarrhea, or any other associated symptoms at this time. She has FMHx of heart disease.   Past Medical History  Diagnosis Date  . Hypertension   . Asthma     seasonal; spring/fall  . Arthritis   . Anxiety   . PONV (postoperative nausea and vomiting)   . Glaucoma   . Neutropenia, cyclic (HCC)     Benign, followed by hematology  . Sleep apnea     CPAP at night  . GERD (gastroesophageal reflux disease)   . Dyslipidemia   . Diabetes mellitus without complication ([place])   . Family history of early CAD     Dobutamine Cardiolite Study 04/03/2002--Ejection Fraction on post stress images is 76%  . Thrombocytopenia [facility])    Past Surgical History  Procedure Laterality Date  . Vaginal hysterectomy  1985    [place], partial  . Tonsillectomy  1972  . Knee arthroscopy  07/19/2011    Procedure: ARTHROSCOPY KNEE;  Surgeon: Sanjuana Kava;  Location: [facility];  Service: Orthopedics;  Laterality: Left;  partial medial menisectomy anterior horn, removal of loose body  . Colonoscopy  08/09/2008      [id number] rectum, few right-sided diverticula, diminutive  polypoid mucosa at the ileocecal valve status post  cold biopsy removal Remainder of colonic mucosa appeared normal  . Colonoscopy, esophagogastroduodenoscopy (egd) and esophageal dilation N/A 09/29/2013    Procedure: COLONOSCOPY, ESOPHAGOGASTRODUODENOSCOPY (EGD) AND ESOPHAGEAL DILATION (ED);  Surgeon: Daneil Dolin, MD;  Location: [facility];  Service: Endoscopy;  Laterality: N/A;  12:15  . Transthoracic echocardiogram  2003    EF normal, moderate asymmetric LVH; AV tri-leaflet, mild aortic sclerosis, mild mitral annular calcification, mild MR, mild TR  . Nm myocar perf wall motion   05/2006    bruce myoview - normal perfusion, EF 75%, low risk   Family History  Problem Relation Age of Onset  . Hypotension Neg Hx   . Malignant hyperthermia Neg Hx   . Pseudochol deficiency Neg Hx   . Leukemia Mother   . Hyperlipidemia Mother   . Prostate cancer Maternal Grandfather   . Kidney failure Paternal Grandmother   . Gastric cancer Paternal Grandmother   . Colon cancer Neg Hx   . Diabetes Brother   . Heart disease Father   . Clotting disorder Brother     blood clots  . Heart disease Paternal Grandfather    Social History  Substance Use Topics  . Smoking status: Former Smoker -- 0.25 packs/day for 8.5 years    Types: Cigarettes    Quit date: 07/15/1978  . Smokeless tobacco: Never Used  . Alcohol Use: Yes     Comment: occasional   OB History    No data available     Review of Systems  Constitutional: Positive for fatigue. Negative for fever, chills and diaphoresis.  HENT: Negative for rhinorrhea and sore throat.   Eyes: Negative for visual disturbance.  Respiratory: Positive for shortness of breath (intermittent). Negative for cough.   Cardiovascular: Positive for chest pain.  Gastrointestinal: Negative for nausea, vomiting, abdominal pain and diarrhea.  Genitourinary: Negative for dysuria.  Musculoskeletal: Negative for back pain and joint swelling.  Skin: Negative for rash.  Neurological: Positive for dizziness. Negative for headaches.  Hematological: Does not bruise/bleed easily.  Psychiatric/Behavioral: Negative for confusion.      Allergies  Trilyte  Home Medications   Prior to Admission medications   Medication Sig Start Date End Date Taking? Authorizing Provider  albuterol (PROVENTIL HFA;VENTOLIN HFA) 108 (90 BASE) MCG/ACT inhaler Inhale 2 puffs into the lungs every 6 (six) hours as needed for wheezing. For shortness of breath   Yes Historical Provider, MD  aspirin 81 MG chewable tablet Chew 1 tablet by mouth every evening.    Yes Historical  Provider, MD  brimonidine (ALPHAGAN) 0.15 % ophthalmic solution Place 1 drop into both eyes 2 (two) times daily.     Yes Historical Provider, MD  Calcium-Vitamin D (CALTRATE 600 PLUS-VIT D PO) Take 1 tablet by mouth daily.     Yes Historical Provider, MD  fluticasone (FLOVENT HFA) 110 MCG/ACT inhaler Inhale 1 puff into the lungs daily as needed (FOR SHORTNESS OF BREATH). For shortness of breath   Yes Historical Provider, MD  hydrochlorothiazide (HYDRODIURIL) 25 MG tablet TAKE 1 TABLET BY MOUTH EVERY DAY 02/08/15  Yes Leonie Man, MD  lansoprazole (PREVACID) 30 MG capsule TAKE 1 CAPSULE BY MOUTH EVERY DAY 03/18/15  Yes Carlis Stable, NP  Multiple Vitamins-Minerals (CENTRUM SILVER PO) Take 1 capsule by mouth daily.   Yes Historical Provider, MD  NON FORMULARY Take by mouth daily as needed. Aloe vera juice   Yes Historical Provider, MD  NON FORMULARY at bedtime. Uses cpap at night   Yes  Historical Provider, MD  Omega-3 Fatty Acids (FISH OIL) 1000 MG CAPS Take 1 capsule by mouth daily.   Yes Historical Provider, MD  Potassium 99 MG TABS Take 1 tablet by mouth daily as needed (SUPPLEMENT). Takes infrequently with no particular schedule   Yes Historical Provider, MD  simvastatin (ZOCOR) 40 MG tablet Take 1 tablet (40 mg total) by mouth at bedtime. Patient taking differently: Take 20 mg by mouth at bedtime.  12/07/14  Yes Leonie Man, MD  Travoprost, BAK Free, (TRAVATAN) 0.004 % SOLN ophthalmic solution Place 1 drop into both eyes at bedtime.     Yes Historical Provider, MD  vitamin C (ASCORBIC ACID) 500 MG tablet Take 1 tablet by mouth daily.   Yes Historical Provider, MD   BP 146/73 mmHg  Pulse 66  Temp(Src) 98.6 F (37 C) (Oral)  Resp 14  Ht 5\' 3"  (1.6 m)  Wt 173 lb (78.472 kg)  BMI 30.65 kg/m2  SpO2 100% Physical Exam  Constitutional: She is oriented to person, place, and time. She appears well-developed and well-nourished. No distress.  HENT:  Head: Normocephalic and atraumatic.   Mouth/Throat: Oropharynx is clear and moist and mucous membranes are normal.  Eyes: Conjunctivae and EOM are normal. Pupils are equal, round, and reactive to light. No scleral icterus.  Neck: Neck supple. No tracheal deviation present.  Cardiovascular: Normal rate and regular rhythm.   Pulmonary/Chest: Effort normal and breath sounds normal. No respiratory distress.  Abdominal: Bowel sounds are normal.  Musculoskeletal: Normal range of motion.  Dorsalis pedis pulse bilaterally 2+ No edema in bilateral ankles  Neurological: She is alert and oriented to person, place, and time. She has normal reflexes.  Skin: Skin is warm and dry.  Psychiatric: She has a normal mood and affect. Her behavior is normal.  Nursing note and vitals reviewed.   ED Course  Procedures (including critical care time)  DIAGNOSTIC STUDIES: Oxygen Saturation is 100% on RA, normal by my interpretation.    COORDINATION OF CARE: 10:32 PM Will order ED EKG. Discussed treatment plan with pt at bedside and pt agreed to plan.  Results for orders placed or performed during the hospital encounter of [id number]  Basic metabolic panel  Result Value Ref Range   Sodium 140 135 - 145 mmol/L   Potassium 3.5 3.5 - 5.1 mmol/L   Chloride 99 (L) 101 - 111 mmol/L   CO2 31 22 - 32 mmol/L   Glucose, Bld 105 (H) 65 - 99 mg/dL   BUN 18 6 - 20 mg/dL   Creatinine, Ser 0.92 0.44 - 1.00 mg/dL   Calcium 9.9 8.9 - 10.3 mg/dL   GFR calc non Af Amer >60 >60 mL/min   GFR calc Af Amer >60 >60 mL/min   Anion gap 10 5 - 15  CBC  Result Value Ref Range   WBC 5.3 4.0 - 10.5 K/uL   RBC 4.55 3.87 - 5.11 MIL/uL   Hemoglobin 13.5 12.0 - 15.0 g/dL   HCT 40.6 36.0 - 46.0 %   MCV 89.2 78.0 - 100.0 fL   MCH 29.7 26.0 - 34.0 pg   MCHC 33.3 30.0 - 36.0 g/dL   RDW 14.1 11.5 - 15.5 %   Platelets 264 150 - 400 K/uL  Troponin I  Result Value Ref Range   Troponin I <0.03 <0.031 ng/mL   Dg Chest 2 View  09/01/2015  CLINICAL DATA:  Chest pain for  several hours EXAM: CHEST  2 VIEW COMPARISON:  None.  FINDINGS: Lungs are clear. Heart size and pulmonary vascularity are normal. No adenopathy. No pneumothorax. No bone lesions. IMPRESSION: No edema or consolidation. Electronically Signed   By: Lowella Grip III M.D.   On: 09/01/2015 19:03    Medications - No data to display     EKG Interpretation   Date/Time:  Thursday September 01 2015 22:09:26 EST Ventricular Rate:  64 PR Interval:  162 QRS Duration: 96 QT Interval:  404 QTC Calculation: 417 R Axis:   -57 Text Interpretation:  Sinus rhythm Left anterior fascicular block Abnormal  R-wave progression, early transition Probable left ventricular hypertrophy  ST elevation, consider inferior injury No significant change since last  tracing Confirmed by Marisa Hufstetler  MD, Bobbye Petti [phone]) on 09/01/2015 10:18:21 PM      MDM   Final diagnoses:  Chest pain, unspecified chest pain type    I personally performed the services described in this documentation, which was scribed in my presence. The recorded information has been reviewed and is accurate.        [name], MD 09/01/15 [phone]

## 2015-09-01 NOTE — ED Notes (Signed)
Pt reports cp in central chest that has gotten worse over few days with no radiation, pt feels weak.

## 2015-09-01 NOTE — Discharge Instructions (Signed)
Aspirin and Your Heart  Aspirin is a medicine that affects the way blood clots. Aspirin can be used to help reduce the risk of blood clots, heart attacks, and other heart-related problems.  SHOULD I TAKE ASPIRIN? Your health care provider will help you determine whether it is safe and beneficial for you to take aspirin daily. Taking aspirin daily may be beneficial if you:  Have had a heart attack or chest pain.  Have undergone open heart surgery such as coronary artery bypass surgery (CABG).  Have had coronary angioplasty.  Have experienced a stroke or transient ischemic attack (TIA).  Have peripheral vascular disease (PVD).  Have chronic heart rhythm problems such as atrial fibrillation. ARE THERE ANY RISKS OF TAKING ASPIRIN DAILY? Daily use of aspirin can increase your risk of side effects. Some of these include:  Bleeding. Bleeding problems can be minor or serious. An example of a minor problem is a cut that does not stop bleeding. An example of a more serious problem is stomach bleeding or bleeding into the brain. Your risk of bleeding is increased if you are also taking non-steroidal anti-inflammatory medicine (NSAIDs).  Increased bruising.  Upset stomach.  An allergic reaction. People who have nasal polyps have an increased risk of developing an aspirin allergy. WHAT ARE SOME GUIDELINES I SHOULD FOLLOW WHEN TAKING ASPIRIN?   Take aspirin only as directed by your health care provider. Make sure you understand how much you should take and what form you should take. The two forms of aspirin are:  Non-enteric-coated. This type of aspirin does not have a coating and is absorbed quickly. Non-enteric-coated aspirin is usually recommended for people with chest pain. This type of aspirin also comes in a chewable form.  Enteric-coated. This type of aspirin has a special coating that releases the medicine very slowly. Enteric-coated aspirin causes less stomach upset than non-enteric-coated  aspirin. This type of aspirin should not be chewed or crushed.  Drink alcohol in moderation. Drinking alcohol increases your risk of bleeding. WHEN SHOULD I SEEK MEDICAL CARE?   You have unusual bleeding or bruising.  You have stomach pain.  You have an allergic reaction. Symptoms of an allergic reaction include:  Hives.  Itchy skin.  Swelling of the lips, tongue, or face.  You have ringing in your ears. WHEN SHOULD I SEEK IMMEDIATE MEDICAL CARE?   Your bowel movements are bloody, dark red, or black in color.  You vomit or cough up blood.  You have blood in your urine.  You cough, wheeze, or feel short of breath. If you have any of the following symptoms, this is an emergency. Do not wait to see if the pain will go away. Get medical help at once. Call your local emergency services (911 in the [place] Do not drive yourself to the hospital.  You have severe chest pain, especially if the pain is crushing or pressure-like and spreads to the arms, back, neck, or jaw.  You have stroke-like symptoms, such as:   Loss of vision.   Difficulty talking.   Numbness or weakness on one side of your body.   Numbness or weakness in your arm or leg.   Not thinking clearly or feeling confused.    This information is not intended to replace advice given to you by your health care provider. Make sure you discuss any questions you have with your health care provider.   Keep appointment to follow-up with her cardiologist on Wednesday. Could consider taking a baby aspirin  a day. Based on your medication list it appears that you're already doing that. Return for any new or worse symptoms. Today's workup without any significant findings.    Document Released: 06/07/2008 Document Revised: 07/16/2014 Document Reviewed: 09/30/2013 Elsevier Interactive Patient Education [employer].

## 2015-09-07 ENCOUNTER — Encounter: Payer: Self-pay | Admitting: Cardiology

## 2015-09-07 ENCOUNTER — Ambulatory Visit (INDEPENDENT_AMBULATORY_CARE_PROVIDER_SITE_OTHER): Payer: Medicare Other | Admitting: Cardiology

## 2015-09-07 VITALS — BP 108/62 | HR 56 | Ht 63.0 in | Wt 174.0 lb

## 2015-09-07 DIAGNOSIS — R1310 Dysphagia, unspecified: Secondary | ICD-10-CM

## 2015-09-07 DIAGNOSIS — R1319 Other dysphagia: Secondary | ICD-10-CM

## 2015-09-07 DIAGNOSIS — R1314 Dysphagia, pharyngoesophageal phase: Secondary | ICD-10-CM | POA: Diagnosis not present

## 2015-09-07 DIAGNOSIS — E785 Hyperlipidemia, unspecified: Secondary | ICD-10-CM | POA: Diagnosis not present

## 2015-09-07 DIAGNOSIS — R079 Chest pain, unspecified: Secondary | ICD-10-CM | POA: Insufficient documentation

## 2015-09-07 DIAGNOSIS — I10 Essential (primary) hypertension: Secondary | ICD-10-CM | POA: Diagnosis not present

## 2015-09-07 DIAGNOSIS — R072 Precordial pain: Secondary | ICD-10-CM | POA: Insufficient documentation

## 2015-09-07 HISTORY — PX: NM MYOVIEW LTD: HXRAD82

## 2015-09-07 NOTE — Progress Notes (Signed)
PCP: Purvis Kilts, MD  Clinic Note: Chief Complaint  Patient presents with  . Follow-up    Fluttering feeling.  . Chest Pain    Discomfort    HPI: Danielle [name] is a 75 y.o. female with a PMH below who presents today for six-month of her cardiac risk factors including hypertension, hyperlipidemia, and borderline obesity.  She is complaining of an intermittent fluttering sensation and chest discomfort.Danielle [name] was last seen on 11/15/2014. He was doing quite well without any major symptoms. She was being tended to her diet closely and was doing exercise, but not to the extent that she had been. Negative cardiac review of symptoms.  Recent Hospitalizations: ER visit on February 23 for chest pain - substernal pain radiating to both shoulders associated with dizziness fatigue and dyspnea that was intermittent. Her symptoms have been intermittent and worsening over 3 weeks. Pain was severe on evening of presentation, and she was very concerned. She was ruled out for MI with cardiac enzymes and had a normal EKG.  Studies Reviewed: No recent cardiac evaluations. Most recent Myoview was in 2007  Interval History: Danielle [name] presents today to follow-up from her ER visit. She said that she was describing a sense of burping and gas discomfort that felt like an indigestion symptom with a, bloating sensation in her belly. She she did note some intermittent dyspnea, but could not really say if it was exertional or not. She hasn't felt like going to the gym ever since the symptoms began. But she says that she always parks as far away from things that she can and oriented walk more and she did on file significant fatigue or being tired. She may get a bit short of breath if she goes up and down a lot of steps.  The chest discomfort episodes she describes as being spells that last 5-10 minutes. They this basally, on out of nowhere. She can't really felt as any association with any  particular activity, but doesn't sit with a are exacerbated by walking. They've have occurred at rest and with walking. When they occur visit pressure-like sensation in the middle of her chest radiating to the shoulders. She feels short of breath or make her and then once that symptom has gone she is left residual fatigue and just exhaustion. She doesn't note rapid irregular heartbeats, does note some quivering in her chest every now and then. This often associates up with her chest discomfort symptoms. She denies any PND, orthopnea or edema. No syncope/near syncope or TIA/amaurosis fugax.  No claudication.  ROS: A comprehensive was performed. Review of Systems  Constitutional: Positive for malaise/fatigue (Ever since the symptoms have begun. Most notably after the symptom spells).  HENT: Negative for congestion and nosebleeds.   Eyes: Negative for blurred vision.  Respiratory: Positive for shortness of breath (With heavy exertion).   Cardiovascular: Positive for palpitations (quivering).  Gastrointestinal: Positive for heartburn. Negative for blood in stool and melena.       Bloating, burping  Genitourinary: Negative for hematuria.  Musculoskeletal: Negative for myalgias and falls.  Neurological: Negative for headaches.  Endo/Heme/Allergies: Does not bruise/bleed easily.  Psychiatric/Behavioral: The patient is nervous/anxious.   All other systems reviewed and are negative.   Past Medical History  Diagnosis Date  . Hypertension   . Asthma     seasonal; spring/fall  . Arthritis   . Anxiety   . PONV (postoperative nausea and vomiting)   . Glaucoma   .  Neutropenia, cyclic (HCC)     Benign, followed by hematology  . Sleep apnea     CPAP at night  . GERD (gastroesophageal reflux disease)   . Dyslipidemia   . Diabetes mellitus without complication ([place])   . Family history of early CAD     Dobutamine Cardiolite Study 04/03/2002--Ejection Fraction on post stress images is 76%  .  Thrombocytopenia [facility])     Past Surgical History  Procedure Laterality Date  . Vaginal hysterectomy  1985    [place], partial  . Tonsillectomy  1972  . Knee arthroscopy  07/19/2011    Procedure: ARTHROSCOPY KNEE;  Surgeon: Sanjuana Kava;  Location: [facility];  Service: Orthopedics;  Laterality: Left;  partial medial menisectomy anterior horn, removal of loose body  . Colonoscopy  08/09/2008      [id number] rectum, few right-sided diverticula, diminutive  polypoid mucosa at the ileocecal valve status post cold biopsy removal Remainder of colonic mucosa appeared normal  . Colonoscopy, esophagogastroduodenoscopy (egd) and esophageal dilation N/A 09/29/2013    Procedure: COLONOSCOPY, ESOPHAGOGASTRODUODENOSCOPY (EGD) AND ESOPHAGEAL DILATION (ED);  Surgeon: Daneil Dolin, MD;  Location: [facility];  Service: Endoscopy;  Laterality: N/A;  12:15  . Transthoracic echocardiogram  2003    EF normal, moderate asymmetric LVH; AV tri-leaflet, mild aortic sclerosis, mild mitral annular calcification, mild MR, mild TR  . Nm myocar perf wall motion  05/2006    bruce myoview - normal perfusion, EF 75%, low risk    Prior to Admission medications   Medication Sig Start Date End Date Taking? Authorizing Provider  albuterol (PROVENTIL HFA;VENTOLIN HFA) 108 (90 BASE) MCG/ACT inhaler Inhale 2 puffs into the lungs every 6 (six) hours as needed for wheezing. For shortness of breath   Yes Historical Provider, MD  aspirin 81 MG chewable tablet Chew 1 tablet by mouth every evening.    Yes Historical Provider, MD  brimonidine (ALPHAGAN) 0.15 % ophthalmic solution Place 1 drop into both eyes 2 (two) times daily.     Yes Historical Provider, MD  Calcium-Vitamin D (CALTRATE 600 PLUS-VIT D PO) Take 1 tablet by mouth daily.     Yes Historical Provider, MD  fluticasone (FLOVENT HFA) 110 MCG/ACT inhaler Inhale 1 puff into the lungs daily as needed (FOR SHORTNESS OF BREATH). For shortness of breath   Yes Historical Provider, MD   hydrochlorothiazide (HYDRODIURIL) 25 MG tablet TAKE 1 TABLET BY MOUTH EVERY DAY 02/08/15  Yes Leonie Man, MD  lansoprazole (PREVACID) 30 MG capsule TAKE 1 CAPSULE BY MOUTH EVERY DAY 03/18/15  Yes Carlis Stable, NP  Multiple Vitamins-Minerals (CENTRUM SILVER PO) Take 1 capsule by mouth daily.   Yes Historical Provider, MD  NON FORMULARY Take by mouth daily as needed. Aloe vera juice   Yes Historical Provider, MD  NON FORMULARY at bedtime. Uses cpap at night   Yes Historical Provider, MD  Omega-3 Fatty Acids (FISH OIL) 1000 MG CAPS Take 1 capsule by mouth daily.   Yes Historical Provider, MD  Potassium 99 MG TABS Take 1 tablet by mouth daily as needed (SUPPLEMENT). Takes infrequently with no particular schedule   Yes Historical Provider, MD  simvastatin (ZOCOR) 40 MG tablet Take 1 tablet (40 mg total) by mouth at bedtime. Patient taking differently: Take 20 mg by mouth at bedtime.  12/07/14  Yes Leonie Man, MD  Travoprost, BAK Free, (TRAVATAN) 0.004 % SOLN ophthalmic solution Place 1 drop into both eyes at bedtime.     Yes Historical Provider,  MD  vitamin C (ASCORBIC ACID) 500 MG tablet Take 1 tablet by mouth daily.   Yes Historical Provider, MD   Allergies  Allergen Reactions  . Trilyte [Peg 3350-Kcl-Na Bicarb-Nacl] Swelling    Swelling of tongue    Social History   Social History  . Marital Status: Married    Spouse Name: N/A  . Number of Children: 1  . Years of Education: 16   Occupational History  . Retired Tourist information centre manager    Social History Main Topics  . Smoking status: Former Smoker -- 0.25 packs/day for 8.5 years    Types: Cigarettes    Quit date: 07/15/1978  . Smokeless tobacco: Never Used  . Alcohol Use: Yes     Comment: occasional  . Drug Use: No  . Sexual Activity: Not Asked   Other Topics Concern  . None   Social History Narrative   Family History  Problem Relation Age of Onset  . Hypotension Neg Hx   . Malignant hyperthermia Neg Hx   . Pseudochol deficiency  Neg Hx   . Leukemia Mother   . Hyperlipidemia Mother   . Prostate cancer Maternal Grandfather   . Kidney failure Paternal Grandmother   . Gastric cancer Paternal Grandmother   . Colon cancer Neg Hx   . Diabetes Brother   . Heart disease Father   . Clotting disorder Brother     blood clots  . Heart disease Paternal Grandfather     Wt Readings from Last 3 Encounters:  09/07/15 174 lb (78.926 kg)  09/01/15 173 lb (78.472 kg)  11/15/14 175 lb 8 oz (79.606 kg)    PHYSICAL EXAM BP 108/62 mmHg  Pulse 56  Ht 5\' 3"  (1.6 m)  Wt 174 lb (78.926 kg)  BMI 30.83 kg/m2 General appearance: alert, cooperative, appears stated age, no distress and mildly obese Neck: no adenopathy, no carotid bruit and no JVD Lungs: clear to auscultation bilaterally, normal percussion bilaterally and non-labored Heart: regular rate and rhythm, S1& S2 normal, no murmur, click, rub or gallop ;nondisplaced PMI Abdomen: soft, non-tender; bowel sounds normal; no masses, no organomegaly;  Extremities: extremities normal, atraumatic, no cyanosis, and edema  Pulses: 2+ and symmetric;  Skin: mobility and turgor normal Neurologic: Mental status: Alert, oriented, thought content appropriate Psych: Normal mood and affect. Very pleasant Cranial nerves: normal (II-XII grossly intact)   Adult ECG Report  Rate: 56 ;  Rhythm: sinus bradycardia and LAFB (-46). Otherwise normal durations and intervals.;   Narrative Interpretation: Otherwise normal EKG   Other studies Reviewed: Additional studies/ records that were reviewed today include:  Recent Labs:   Recently checked by PCP No results found for: CHOL, HDL, LDLCALC, LDLDIRECT, TRIG, CHOLHDL    ASSESSMENT / PLAN: Problem List Items Addressed This Visit    Hyperlipidemia (Chronic)    I will have her recent lab values. She said that her PCP just checked them. She is on simvastatin without myalgias.      Essential hypertension (Chronic)    Well-controlled on HCTZ  alone. Cannot use beta blocker because of bradycardia I would be limited as to how to treat her "anginal symptoms" based on her blood pressure and heart rate range.      Esophageal dysphagia    Would hope to avoid having the do invasive treatments with stents to avoid needing to use it up at agents.      Chest pain with moderate risk for cardiac etiology - Primary    Not really sure what  to make of her symptoms. With more concerning is the fact that she feels so poorly after the symptoms occur and is a quivering sensation of dyspnea. Very unusual that it would occur both with rest and exertion but not exacerbated by exertion. However with her family history and her on personal history given her age, I think is at least a moderate chance that this could be cardiac. She is concerned enough that she has stopped exercising. Plan: Treadmill Myoview      Relevant Orders   EKG 12-Lead (Completed)   Myocardial Perfusion Imaging      Current medicines are reviewed at length with the patient today. (+/- concerns) no questions about meds The following changes have been made: No current changes Studies Ordered:   Orders Placed This Encounter  Procedures  . Myocardial Perfusion Imaging  . EKG 12-Lead    Follow-up in one month. After Doretha Sou, M.D., M.S. Interventional Cardiologist   Pager # [phone] Phone # [phone] [address] [place], [place] [address]

## 2015-09-07 NOTE — Patient Instructions (Signed)
Schedule Stress Myoview   Follow Up with Dr.Harding in 1 month

## 2015-09-09 ENCOUNTER — Encounter: Payer: Self-pay | Admitting: Cardiology

## 2015-09-09 NOTE — Assessment & Plan Note (Signed)
I will have her recent lab values. She said that her PCP just checked them. She is on simvastatin without myalgias.

## 2015-09-09 NOTE — Assessment & Plan Note (Signed)
Not really sure what to make of her symptoms. With more concerning is the fact that she feels so poorly after the symptoms occur and is a quivering sensation of dyspnea. Very unusual that it would occur both with rest and exertion but not exacerbated by exertion. However with her family history and her on personal history given her age, I think is at least a moderate chance that this could be cardiac. She is concerned enough that she has stopped exercising. Plan: Treadmill Myoview

## 2015-09-09 NOTE — Assessment & Plan Note (Signed)
Well-controlled on HCTZ alone. Cannot use beta blocker because of bradycardia I would be limited as to how to treat her "anginal symptoms" based on her blood pressure and heart rate range.

## 2015-09-09 NOTE — Assessment & Plan Note (Signed)
Would hope to avoid having the do invasive treatments with stents to avoid needing to use it up at agents.

## 2015-09-13 ENCOUNTER — Telehealth (HOSPITAL_COMMUNITY): Payer: Self-pay

## 2015-09-13 NOTE — Telephone Encounter (Signed)
Encounter complete. 

## 2015-09-15 ENCOUNTER — Ambulatory Visit (HOSPITAL_COMMUNITY)
Admission: RE | Admit: 2015-09-15 | Discharge: 2015-09-15 | Disposition: A | Discharge status: Home / Self Care | Payer: Medicare Other | Source: Ambulatory Visit | Attending: Cardiology | Admitting: Cardiology

## 2015-09-15 DIAGNOSIS — R42 Dizziness and giddiness: Secondary | ICD-10-CM | POA: Diagnosis not present

## 2015-09-15 DIAGNOSIS — Z8249 Family history of ischemic heart disease and other diseases of the circulatory system: Secondary | ICD-10-CM | POA: Diagnosis not present

## 2015-09-15 DIAGNOSIS — Z87891 Personal history of nicotine dependence: Secondary | ICD-10-CM | POA: Diagnosis not present

## 2015-09-15 DIAGNOSIS — R079 Chest pain, unspecified: Secondary | ICD-10-CM

## 2015-09-15 DIAGNOSIS — I10 Essential (primary) hypertension: Secondary | ICD-10-CM | POA: Diagnosis not present

## 2015-09-15 DIAGNOSIS — R0609 Other forms of dyspnea: Secondary | ICD-10-CM | POA: Insufficient documentation

## 2015-09-15 LAB — MYOCARDIAL PERFUSION IMAGING
CHL CUP MPHR: 148 {beats}/min
CHL CUP RESTING HR STRESS: 65 {beats}/min
CHL RATE OF PERCEIVED EXERTION: 15
CSEPEW: 7 METS
CSEPHR: 109 %
CSEPPHR: 162 {beats}/min
Exercise duration (min): 6 min
LVDIAVOL: 59 mL (ref 46–106)
LVSYSVOL: 20 mL
NUC STRESS TID: 1.19
SDS: 1
SRS: 0
SSS: 1

## 2015-09-15 MED ORDER — TECHNETIUM TC 99M SESTAMIBI GENERIC - CARDIOLITE
31.4000 | Freq: Once | INTRAVENOUS | Status: AC | PRN
  Administered 2015-09-15: 31 via INTRAVENOUS

## 2015-09-15 MED ORDER — TECHNETIUM TC 99M SESTAMIBI GENERIC - CARDIOLITE
9.9000 | Freq: Once | INTRAVENOUS | Status: AC | PRN
  Administered 2015-09-15: 10 via INTRAVENOUS

## 2015-09-16 NOTE — Progress Notes (Signed)
Quick Note:  Stress Test looked good!! No sign of significant Heart Artery Disease. Pump function is normal.  Good news!!.  Leonie Man, MD  Pls forward to PCP: Sharilyn Sites, MD ______

## 2015-10-11 ENCOUNTER — Encounter: Payer: Self-pay | Admitting: Cardiology

## 2015-10-11 ENCOUNTER — Ambulatory Visit (INDEPENDENT_AMBULATORY_CARE_PROVIDER_SITE_OTHER): Payer: Medicare Other | Admitting: Cardiology

## 2015-10-11 VITALS — BP 118/68 | HR 62 | Ht 63.0 in | Wt 175.6 lb

## 2015-10-11 DIAGNOSIS — I10 Essential (primary) hypertension: Secondary | ICD-10-CM

## 2015-10-11 DIAGNOSIS — E785 Hyperlipidemia, unspecified: Secondary | ICD-10-CM | POA: Diagnosis not present

## 2015-10-11 DIAGNOSIS — R079 Chest pain, unspecified: Secondary | ICD-10-CM

## 2015-10-11 MED ORDER — COQ-10 100 MG PO CAPS: 300.0000 mg | ORAL_CAPSULE | Freq: Every day | ORAL | Status: DC

## 2015-10-11 NOTE — Patient Instructions (Signed)
NO CHANGE IN CURRENT MEDICATIONS   START TAKING CoQ10  100 MG FOR WEEK INCREASE TO 300 MG -DAILY  Your physician wants you to follow-up in [place], You will receive a reminder letter in the mail two months in advance. If you don't receive a letter, please call our office to schedule the follow-up appointment.  If you need a refill on your cardiac medications before your next appointment, please call your pharmacy.

## 2015-10-11 NOTE — Progress Notes (Signed)
PCP: Purvis Kilts, MD  Clinic Note: Chief Complaint  Patient presents with  . Follow-up    Recent Myoview for chest pain evaluation    HPI: Danielle [name] is a 75 y.o. female with a PMH below who presents today for Myoview f/u after presenting with c/o an intermittent fluttering sensation and chest discomfort.Danielle [name] was last seen on 09/07/2015 in follow-up from a hospital visit for chest pain. This was evaluated with a Myoview stress test the following day that was read as low risk with no ischemia or infarction.   Recent Hospitalizations: ER visit on February 23 for chest pain - substernal pain radiating to both shoulders associated with dizziness fatigue and dyspnea that was intermittent. Her symptoms have been intermittent and worsening over 3 weeks. Pain was severe on evening of presentation, and she was very concerned. She was ruled out for MI with cardiac enzymes and had a normal EKG.  Studies Reviewed:  Myoview 09/2015:  Study Highlights     Nuclear stress EF: 66%.  The left ventricular ejection fraction is hyperdynamic (>65%).  Blood pressure demonstrated a hypertensive response to exercise.  There was no ST segment deviation noted during stress.  No T wave inversion was noted during stress.  The study is normal.  This is a low risk study.    Interval History: Ms. Danielle [name] presents here today doing fine without any further episodes of chest tightness or pressure with rest or exertion. She is now back to doing full exercise, not having any resting or exertional chest tightness or pressure. No real exertional dyspnea. As she is doing more exercise, her fatigue is improved. She doesn't note rapid irregular heartbeats, does note some quivering in her chest every now and then. This often associates up with her chest discomfort symptoms. She denies any PND, orthopnea or edema. No syncope/near syncope or TIA/amaurosis fugax.  No claudication.  ROS:  A comprehensive was performed. Review of Systems  Constitutional: Negative for malaise/fatigue (Notably improved).  HENT: Negative for congestion and nosebleeds.   Eyes: Negative for blurred vision.  Respiratory: Positive for shortness of breath (With heavy exertion).   Cardiovascular: Positive for palpitations (quivering).  Gastrointestinal: Positive for heartburn. Negative for blood in stool and melena.       Bloating, burping  Genitourinary: Negative for hematuria.  Musculoskeletal: Negative for myalgias and falls.  Neurological: Negative for headaches.  Endo/Heme/Allergies: Does not bruise/bleed easily.  Psychiatric/Behavioral: The patient is nervous/anxious.   All other systems reviewed and are negative.   Past Medical History  Diagnosis Date  . Hypertension   . Asthma     seasonal; spring/fall  . Arthritis   . Anxiety   . PONV (postoperative nausea and vomiting)   . Glaucoma   . Neutropenia, cyclic (HCC)     Benign, followed by hematology  . Sleep apnea     CPAP at night  . GERD (gastroesophageal reflux disease)   . Dyslipidemia   . Diabetes mellitus without complication ([place])   . Family history of early CAD     Dobutamine Cardiolite Study 04/03/2002--Ejection Fraction on post stress images is 76%  . Thrombocytopenia [facility])     Past Surgical History  Procedure Laterality Date  . Vaginal hysterectomy  1985    [place], partial  . Tonsillectomy  1972  . Knee arthroscopy  07/19/2011    Procedure: ARTHROSCOPY KNEE;  Surgeon: Sanjuana Kava;  Location: [facility];  Service: Orthopedics;  Laterality: Left;  partial  medial menisectomy anterior horn, removal of loose body  . Colonoscopy  08/09/2008      [id number] rectum, few right-sided diverticula, diminutive  polypoid mucosa at the ileocecal valve status post cold biopsy removal Remainder of colonic mucosa appeared normal  . Colonoscopy, esophagogastroduodenoscopy (egd) and esophageal dilation N/A 09/29/2013    Procedure:  COLONOSCOPY, ESOPHAGOGASTRODUODENOSCOPY (EGD) AND ESOPHAGEAL DILATION (ED);  Surgeon: Daneil Dolin, MD;  Location: [facility];  Service: Endoscopy;  Laterality: N/A;  12:15  . Transthoracic echocardiogram  2003    EF normal, moderate asymmetric LVH; AV tri-leaflet, mild aortic sclerosis, mild mitral annular calcification, mild MR, mild TR  . Nm myocar perf wall motion  05/2006    bruce myoview - normal perfusion, EF 75%, low risk    Prior to Admission medications   Medication Sig Start Date End Date Taking? Authorizing Provider  albuterol (PROVENTIL HFA;VENTOLIN HFA) 108 (90 BASE) MCG/ACT inhaler Inhale 2 puffs into the lungs every 6 (six) hours as needed for wheezing. For shortness of breath   Yes Historical Provider, MD  aspirin 81 MG chewable tablet Chew 1 tablet by mouth every evening.    Yes Historical Provider, MD  brimonidine (ALPHAGAN) 0.15 % ophthalmic solution Place 1 drop into both eyes 2 (two) times daily.     Yes Historical Provider, MD  Calcium-Vitamin D (CALTRATE 600 PLUS-VIT D PO) Take 1 tablet by mouth daily.     Yes Historical Provider, MD  fluticasone (FLOVENT HFA) 110 MCG/ACT inhaler Inhale 1 puff into the lungs daily as needed (FOR SHORTNESS OF BREATH). For shortness of breath   Yes Historical Provider, MD  hydrochlorothiazide (HYDRODIURIL) 25 MG tablet TAKE 1 TABLET BY MOUTH EVERY DAY 02/08/15  Yes Leonie Man, MD  lansoprazole (PREVACID) 30 MG capsule TAKE 1 CAPSULE BY MOUTH EVERY DAY 03/18/15  Yes Carlis Stable, NP  Multiple Vitamins-Minerals (CENTRUM SILVER PO) Take 1 capsule by mouth daily.   Yes Historical Provider, MD  NON FORMULARY Take by mouth daily as needed. Aloe vera juice   Yes Historical Provider, MD  NON FORMULARY at bedtime. Uses cpap at night   Yes Historical Provider, MD  Omega-3 Fatty Acids (FISH OIL) 1000 MG CAPS Take 1 capsule by mouth daily.   Yes Historical Provider, MD  Potassium 99 MG TABS Take 1 tablet by mouth daily as needed (SUPPLEMENT). Takes  infrequently with no particular schedule   Yes Historical Provider, MD  simvastatin (ZOCOR) 40 MG tablet Take 1 tablet (40 mg total) by mouth at bedtime. Patient taking differently: Take 20 mg by mouth at bedtime.  12/07/14  Yes Leonie Man, MD  Travoprost, BAK Free, (TRAVATAN) 0.004 % SOLN ophthalmic solution Place 1 drop into both eyes at bedtime.     Yes Historical Provider, MD  vitamin C (ASCORBIC ACID) 500 MG tablet Take 1 tablet by mouth daily.   Yes Historical Provider, MD   Allergies  Allergen Reactions  . Trilyte [Peg 3350-Kcl-Na Bicarb-Nacl] Swelling    Swelling of tongue    Social History   Social History  . Marital Status: Married    Spouse Name: N/A  . Number of Children: 1  . Years of Education: 16   Occupational History  . Retired Tourist information centre manager    Social History Main Topics  . Smoking status: Former Smoker -- 0.25 packs/day for 8.5 years    Types: Cigarettes    Quit date: 07/15/1978  . Smokeless tobacco: Never Used  . Alcohol Use: Yes  Comment: occasional  . Drug Use: No  . Sexual Activity: Not Asked   Other Topics Concern  . None   Social History Narrative   Family History  Problem Relation Age of Onset  . Hypotension Neg Hx   . Malignant hyperthermia Neg Hx   . Pseudochol deficiency Neg Hx   . Leukemia Mother   . Hyperlipidemia Mother   . Prostate cancer Maternal Grandfather   . Kidney failure Paternal Grandmother   . Gastric cancer Paternal Grandmother   . Colon cancer Neg Hx   . Diabetes Brother   . Heart disease Father   . Clotting disorder Brother     blood clots  . Heart disease Paternal Grandfather     Wt Readings from Last 3 Encounters:  10/11/15 175 lb 9.6 oz (79.652 kg)  09/15/15 174 lb (78.926 kg)  09/07/15 174 lb (78.926 kg)    PHYSICAL EXAM BP 118/68 mmHg  Pulse 62  Ht 5\' 3"  (1.6 m)  Wt 175 lb 9.6 oz (79.652 kg)  BMI 31.11 kg/m2 General appearance: alert, cooperative, appears stated age, no distress and mildly  obese Neck: no adenopathy, no carotid bruit and no JVD Lungs: clear to auscultation bilaterally, normal percussion bilaterally and non-labored Heart: regular rate and rhythm, S1& S2 normal, no murmur, click, rub or gallop ;nondisplaced PMI Abdomen: soft, non-tender; bowel sounds normal; no masses, no organomegaly;  Extremities: extremities normal, atraumatic, no cyanosis, and edema  Pulses: 2+ and symmetric;  Skin: mobility and turgor normal Neurologic: Mental status: Alert, oriented, thought content appropriate Psych: Normal mood and affect. Very pleasant Cranial nerves: normal (II-XII grossly intact)   Adult ECG Report Not checked.  Other studies Reviewed: Additional studies/ records that were reviewed today include:  Recent Labs:   Recently checked by PCP No results found for: CHOL, HDL, LDLCALC, LDLDIRECT, TRIG, CHOLHDL    ASSESSMENT / PLAN: Problem List Items Addressed This Visit    Hyperlipidemia (Chronic)    Followed by PCP. On simvastatin. I recommended adding coenzyme Q 10 titrating from 100 mg daily to 300 mg daily over the course of the month.      Relevant Medications   simvastatin (ZOCOR) 20 MG tablet   Essential hypertension (Chronic)    Excellent control on HCTZ.      Relevant Medications   simvastatin (ZOCOR) 20 MG tablet   Chest pain with low risk for cardiac etiology - Primary    Well I was not overly sure what to make of her symptoms prior to the stress test, now that we have had performed and was negative, clearly  not anginal chest discomfort. She is now back to doing her full exercise without any significant symptoms.         Current medicines are reviewed at length with the patient today. (+/- concerns) no questions about meds The following changes have been made: No current changes - simply add co-Q10 - start @ 100 & increase to 300 mg   Studies Ordered:   No orders of the defined types were placed in this encounter.    Follow-up in  one Year.    Leonie Man, M.D., M.S. Interventional Cardiologist   Pager # [phone] Phone # [phone] [address] [place], [place] [address]

## 2015-10-13 ENCOUNTER — Encounter: Payer: Self-pay | Admitting: Cardiology

## 2015-10-13 NOTE — Assessment & Plan Note (Signed)
Excellent control on HCTZ.

## 2015-10-13 NOTE — Assessment & Plan Note (Addendum)
Followed by PCP. On simvastatin. I recommended adding coenzyme Q 10 titrating from 100 mg daily to 300 mg daily over the course of the month.

## 2015-10-13 NOTE — Assessment & Plan Note (Signed)
Well I was not overly sure what to make of her symptoms prior to the stress test, now that we have had performed and was negative, clearly  not anginal chest discomfort. She is now back to doing her full exercise without any significant symptoms.

## 2015-10-18 ENCOUNTER — Ambulatory Visit: Payer: Medicare Other | Admitting: Cardiology

## 2015-11-23 ENCOUNTER — Other Ambulatory Visit: Payer: Self-pay

## 2015-11-23 DIAGNOSIS — Z1231 Encounter for screening mammogram for malignant neoplasm of breast: Secondary | ICD-10-CM

## 2015-12-08 ENCOUNTER — Other Ambulatory Visit: Payer: Self-pay | Admitting: Cardiology

## 2015-12-08 NOTE — Telephone Encounter (Signed)
Rx(s) sent to pharmacy electronically.  

## 2016-01-06 ENCOUNTER — Ambulatory Visit
Admission: RE | Admit: 2016-01-06 | Discharge: 2016-01-06 | Disposition: A | Discharge status: Home / Self Care | Payer: Medicare Other | Source: Ambulatory Visit

## 2016-01-06 DIAGNOSIS — Z1231 Encounter for screening mammogram for malignant neoplasm of breast: Secondary | ICD-10-CM

## 2016-02-16 ENCOUNTER — Other Ambulatory Visit: Payer: Self-pay | Admitting: Cardiology

## 2016-03-29 ENCOUNTER — Other Ambulatory Visit: Payer: Self-pay | Admitting: Nurse Practitioner

## 2016-04-30 ENCOUNTER — Other Ambulatory Visit: Payer: Self-pay | Admitting: Nurse Practitioner

## 2016-05-01 ENCOUNTER — Encounter: Payer: Self-pay | Admitting: Internal Medicine

## 2016-05-01 NOTE — Telephone Encounter (Signed)
We have not seen the patient in over two years. She will need another ov here to get refills OR she can get from her PCP.

## 2016-05-01 NOTE — Telephone Encounter (Signed)
APPT MADE AND LETTER SENT  °

## 2016-05-01 NOTE — Telephone Encounter (Signed)
Please schedule ov.  

## 2016-05-18 ENCOUNTER — Ambulatory Visit: Payer: Medicare Other | Admitting: Gastroenterology

## 2016-07-15 ENCOUNTER — Other Ambulatory Visit: Payer: Self-pay | Admitting: Cardiology

## 2016-10-18 ENCOUNTER — Ambulatory Visit (INDEPENDENT_AMBULATORY_CARE_PROVIDER_SITE_OTHER): Payer: Medicare Other | Admitting: Cardiology

## 2016-10-18 ENCOUNTER — Encounter: Payer: Self-pay | Admitting: Cardiology

## 2016-10-18 VITALS — BP 100/60 | HR 65 | Ht 63.0 in | Wt 173.0 lb

## 2016-10-18 DIAGNOSIS — I10 Essential (primary) hypertension: Secondary | ICD-10-CM

## 2016-10-18 DIAGNOSIS — E7849 Other hyperlipidemia: Secondary | ICD-10-CM

## 2016-10-18 DIAGNOSIS — E784 Other hyperlipidemia: Secondary | ICD-10-CM | POA: Diagnosis not present

## 2016-10-18 DIAGNOSIS — R079 Chest pain, unspecified: Secondary | ICD-10-CM | POA: Diagnosis not present

## 2016-10-18 DIAGNOSIS — R002 Palpitations: Secondary | ICD-10-CM

## 2016-10-18 MED ORDER — HYDROCHLOROTHIAZIDE 25 MG PO TABS: 25.0000 mg | ORAL_TABLET | Freq: Every day | ORAL | 3 refills | Status: DC

## 2016-10-18 NOTE — Patient Instructions (Signed)
No change with current treatment and medication   Your physician wants you to follow-up in July 2019 with DR HARDING. You will receive a reminder letter in the mail two months in advance. If you don't receive a letter, please call our office to schedule the follow-up appointment.   If you need a refill on your cardiac medications before your next appointment, please call your pharmacy.

## 2016-10-18 NOTE — Progress Notes (Signed)
PCP: Purvis Kilts, MD  Clinic Note: Chief Complaint  Patient presents with  . Follow-up    states being a little tired     HPI: Danielle [name] is a 76 y.o. female with a PMH below who presents today for annual follow-up.  She was initially evaluated for chest discomfort given family history of CAD. For the most part, we have been managing blood pressure.  Danielle [name] was last seen on 10/11/2015 follow-up from a stress test that was done. This showed hyperdynamic LV function with EF greater than 65%. No evidence of ischemia or infarction. (She had been in the hospital in February of this year which substernal chest pain).  Recent Hospitalizations: None  Studies Reviewed: No new studies.  Interval History: Danielle [name] continues to do well.  She is back to her routine workout regimen ~3 days/wk at the fitness center doing cycling, TM & various stretching exercises before light weights.  Other that occasional brief episodes of "fluttering / quivering sensations" in her chest that last < 1 min, she really denies any CV complaints -- these episodes have been going on for years & do not seem to bother her.  Very rare dizzy spells, but no syncope or near syncope.  She denies any further CP or dyspnea with rest or exertion.  No PND, orthopnea or edema.   No TIA/amaurosis fugax symptoms.  No claudication.  ROS: A comprehensive was performed.  Pertinent + above.  Review of Systems  Constitutional: Negative for malaise/fatigue (Just tires out a little easier than usual - not concerned).  HENT: Negative for nosebleeds.   Respiratory: Negative for cough, shortness of breath and wheezing.   Cardiovascular: Positive for palpitations.  Gastrointestinal: Negative for blood in stool, heartburn and melena.  Genitourinary: Negative for hematuria.  Musculoskeletal: Negative for joint pain and myalgias.  Neurological: Positive for dizziness.  Endo/Heme/Allergies: Negative for  environmental allergies. Does not bruise/bleed easily.  Psychiatric/Behavioral: The patient is not nervous/anxious (overall less anxious than last year).   All other systems reviewed and are negative.   Past Medical History:  Diagnosis Date  . Anxiety   . Arthritis   . Asthma    seasonal; spring/fall  . Diabetes mellitus without complication ([place])   . Dyslipidemia   . Family history of early CAD    Dobutamine Cardiolite Study 04/03/2002--Ejection Fraction on post stress images is 76%  . GERD (gastroesophageal reflux disease)   . Glaucoma   . Hypertension   . Neutropenia, cyclic (HCC)    Benign, followed by hematology  . PONV (postoperative nausea and vomiting)   . Sleep apnea    CPAP at night  . Thrombocytopenia ([place])     Past Surgical History:  Procedure Laterality Date  . COLONOSCOPY  08/09/2008     DJT:TSVXBL rectum, few right-sided diverticula, diminutive  polypoid mucosa at the ileocecal valve status post cold biopsy removal Remainder of colonic mucosa appeared normal  . COLONOSCOPY, ESOPHAGOGASTRODUODENOSCOPY (EGD) AND ESOPHAGEAL DILATION N/A 09/29/2013   Procedure: COLONOSCOPY, ESOPHAGOGASTRODUODENOSCOPY (EGD) AND ESOPHAGEAL DILATION (ED);  Surgeon: Daneil Dolin, MD;  Location: [facility];  Service: Endoscopy;  Laterality: N/A;  12:15  . KNEE ARTHROSCOPY  07/19/2011   Procedure: ARTHROSCOPY KNEE;  Surgeon: Sanjuana Kava;  Location: [facility];  Service: Orthopedics;  Laterality: Left;  partial medial menisectomy anterior horn, removal of loose body  . NM MYOVIEW LTD  09/2015   LOW RISK EF 66%, hyperdynamic. HTN response to exercise. NO EKG  changes. No Ischemia or Infarct.    . TONSILLECTOMY  1972  . TRANSTHORACIC ECHOCARDIOGRAM  2003   EF normal, moderate asymmetric LVH; AV tri-leaflet, mild aortic sclerosis, mild mitral annular calcification, mild MR, mild TR  . [place], partial     Current Meds  Medication Sig  . albuterol (PROVENTIL  HFA;VENTOLIN HFA) 108 (90 BASE) MCG/ACT inhaler Inhale 2 puffs into the lungs every 6 (six) hours as needed for wheezing. For shortness of breath  . ALOE VERA JUICE LIQD Take 1 Bottle by mouth daily.  Marland Kitchen aspirin 81 MG chewable tablet Chew 1 tablet by mouth every evening.   . brimonidine (ALPHAGAN) 0.15 % ophthalmic solution Place 1 drop into both eyes 2 (two) times daily.    . Calcium-Vitamin D (CALTRATE 600 PLUS-VIT D PO) Take 1 tablet by mouth daily.    . Coenzyme Q10 (COQ-10) 100 MG CAPS Take 300 mg by mouth daily.  . fluticasone (FLOVENT HFA) 110 MCG/ACT inhaler Inhale 1 puff into the lungs daily as needed (FOR SHORTNESS OF BREATH). For shortness of breath  . hydrochlorothiazide (HYDRODIURIL) 25 MG tablet Take 1 tablet (25 mg total) by mouth daily.  . lansoprazole (PREVACID) 30 MG capsule TAKE 1 CAPSULE BY MOUTH EVERY DAY  . Multiple Vitamins-Minerals (CENTRUM SILVER PO) Take 1 capsule by mouth daily.  . NON FORMULARY Take by mouth daily as needed. Aloe vera juice  . NON FORMULARY at bedtime. Uses cpap at night  . Omega-3 Fatty Acids (FISH OIL) 1000 MG CAPS Take 1 capsule by mouth daily.  . Potassium 99 MG TABS Take 1 tablet by mouth daily as needed (SUPPLEMENT). Takes infrequently with no particular schedule  . simvastatin (ZOCOR) 40 MG tablet TAKE 1 TABLET(40 MG) BY MOUTH AT BEDTIME  . Travoprost, BAK Free, (TRAVATAN) 0.004 % SOLN ophthalmic solution Place 1 drop into both eyes at bedtime.    . vitamin C (ASCORBIC ACID) 500 MG tablet Take 1 tablet by mouth daily.  . [DISCONTINUED] hydrochlorothiazide (HYDRODIURIL) 25 MG tablet TAKE 1 TABLET BY MOUTH EVERY DAY    Allergies  Allergen Reactions  . Trilyte [Peg 3350-Kcl-Na Bicarb-Nacl] Swelling    Swelling of tongue    Social History   Social History  . Marital status: Married    Spouse name: N/A  . Number of children: 1  . Years of education: 31   Occupational History  . Retired Tourist information centre manager Retired   Social History Main Topics  .  Smoking status: Former Smoker    Packs/day: 0.25    Years: 8.50    Types: Cigarettes    Quit date: 07/15/1978  . Smokeless tobacco: Never Used  . Alcohol use Yes     Comment: occasional  . Drug use: No  . Sexual activity: Not Asked   Other Topics Concern  . None   Social History Narrative  . None   Family History family history includes Clotting disorder in her brother; Diabetes in her brother; Gastric cancer in her paternal grandmother; Heart disease in her father and paternal grandfather; Hyperlipidemia in her mother; Kidney failure in her paternal grandmother; Leukemia in her mother; Prostate cancer in her maternal grandfather.  Wt Readings from Last 3 Encounters:  10/18/16 173 lb (78.5 kg)  10/11/15 175 lb 9.6 oz (79.7 kg)  09/15/15 174 lb (78.9 kg)    PHYSICAL EXAM BP 100/60   Pulse 65   Ht 5\' 3"  (1.6 m)   Wt 173 lb (78.5 kg)  BMI 30.65 kg/m  General appearance:Cooperative, appears stated age, no distress and Mildly obese. Otherwise well-appearing, well-groomed Neck: no adenopathy, no carotid bruit and no JVD Lungs: CTAB, normal percussion bilaterally and non-labored Heart: RRR, S1 & S2 normal, no murmur, click, rub or gallop; nondisplaced Abdomen: soft, non-tender; bowel sounds normal; no masses,  no organomegaly; no HJR Extremities: extremities normal, atraumatic, no cyanosis, or edema Pulses: 2+ and symmetric; Neurologic/Psych: Mental status:  A&Ox3. thought content appropriate.  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 ~ Anterior MI, age undetermined (poor R wave progression).  Narrative Interpretation: Stable EKG.   Other studies Reviewed: Additional studies/ records that were reviewed today include:  Recent Labs:  Labs are followed by PCP   No results found for: CHOL, HDL, LDLCALC, LDLDIRECT, TRIG, CHOLHDL Lab Results  Component Value Date   CREATININE 0.92 09/01/2015   BUN 18 09/01/2015   NA 140  09/01/2015   K 3.5 09/01/2015   CL 99 (L) 09/01/2015   CO2 31 09/01/2015    ASSESSMENT / PLAN: Problem List Items Addressed This Visit    Chest pain with low risk for cardiac etiology    No further episodes - evaluated with Myoview that was Low Risk. Continues to be active with relatively vigorous exercise routine -- ? If her Sx were related to MSK pain.      Essential hypertension - Primary (Chronic)    Borderline Hypotensive today on HCTZ, - Continue to monitor - may not require Rx if she continues with her current exercise routine.      Relevant Medications   hydrochlorothiazide (HYDRODIURIL) 25 MG tablet   Other Relevant Orders   EKG 12-Lead (Completed)   Hyperlipidemia (Chronic)    On statin & labs followed by PCP.  Less side effects with Co-Q10      Relevant Medications   hydrochlorothiazide (HYDRODIURIL) 25 MG tablet   Other Relevant Orders   EKG 12-Lead (Completed)   Intermittent palpitations (Chronic)    Chronic symptoms that she always notes, but only if prompted.  Not associated with dyspnea, dizziness or near syncope. I suspect that she is feeling PACs or PVCs -- would not investigate unless Sx worsen or become more frequent.         Current medicines are reviewed at length with the patient today. (+/- concerns) n/a The following changes have been made: -->   Patient Instructions  No change with current treatment and medication   Your physician wants you to follow-up in July 2019 with DR Talley Kreiser. You will receive a reminder letter in the mail two months in advance. If you don't receive a letter, please call our office to schedule the follow-up appointment.   If you need a refill on your cardiac medications before your next appointment, please call your pharmacy.    Studies Ordered:   Orders Placed This Encounter  Procedures  . EKG 12-Lead      Glenetta Hew, M.D., M.S. Interventional Cardiologist   Pager # [phone] Phone #  [phone] [address] [place], [place] [address]

## 2016-10-19 ENCOUNTER — Encounter: Payer: Self-pay | Admitting: Cardiology

## 2016-10-19 DIAGNOSIS — R002 Palpitations: Secondary | ICD-10-CM | POA: Insufficient documentation

## 2016-10-19 NOTE — Assessment & Plan Note (Signed)
On statin & labs followed by PCP.  Less side effects with Co-Q10

## 2016-10-19 NOTE — Assessment & Plan Note (Signed)
Borderline Hypotensive today on HCTZ, - Continue to monitor - may not require Rx if she continues with her current exercise routine.

## 2016-10-19 NOTE — Assessment & Plan Note (Signed)
No further episodes - evaluated with Myoview that was Low Risk. Continues to be active with relatively vigorous exercise routine -- ? If her Sx were related to MSK pain.

## 2016-10-19 NOTE — Assessment & Plan Note (Signed)
Chronic symptoms that she always notes, but only if prompted.  Not associated with dyspnea, dizziness or near syncope. I suspect that she is feeling PACs or PVCs -- would not investigate unless Sx worsen or become more frequent.

## 2017-01-28 ENCOUNTER — Other Ambulatory Visit: Payer: Self-pay | Admitting: Family Medicine

## 2017-01-28 DIAGNOSIS — Z1231 Encounter for screening mammogram for malignant neoplasm of breast: Secondary | ICD-10-CM

## 2017-02-06 ENCOUNTER — Ambulatory Visit
Admission: RE | Admit: 2017-02-06 | Discharge: 2017-02-06 | Disposition: A | Discharge status: Home / Self Care | Payer: Medicare Other | Source: Ambulatory Visit | Attending: Family Medicine | Admitting: Family Medicine

## 2017-02-06 DIAGNOSIS — Z1231 Encounter for screening mammogram for malignant neoplasm of breast: Secondary | ICD-10-CM

## 2017-04-04 ENCOUNTER — Other Ambulatory Visit (HOSPITAL_COMMUNITY): Payer: Self-pay | Admitting: Family Medicine

## 2017-04-04 ENCOUNTER — Ambulatory Visit (HOSPITAL_COMMUNITY)
Admission: RE | Admit: 2017-04-04 | Discharge: 2017-04-04 | Disposition: A | Discharge status: Home / Self Care | Payer: Medicare Other | Source: Ambulatory Visit | Attending: Family Medicine | Admitting: Family Medicine

## 2017-04-04 DIAGNOSIS — M25531 Pain in right wrist: Secondary | ICD-10-CM

## 2017-04-04 DIAGNOSIS — M19031 Primary osteoarthritis, right wrist: Secondary | ICD-10-CM | POA: Insufficient documentation

## 2017-04-14 ENCOUNTER — Other Ambulatory Visit: Payer: Self-pay | Admitting: Cardiology

## 2017-07-01 ENCOUNTER — Encounter (HOSPITAL_COMMUNITY): Payer: Self-pay | Admitting: Emergency Medicine

## 2017-07-01 ENCOUNTER — Emergency Department (HOSPITAL_COMMUNITY)
Admission: EM | Admit: 2017-07-01 | Discharge: 2017-07-01 | Disposition: A | Discharge status: Home / Self Care | Payer: Medicare Other | Attending: Emergency Medicine | Admitting: Emergency Medicine

## 2017-07-01 ENCOUNTER — Other Ambulatory Visit: Payer: Self-pay

## 2017-07-01 ENCOUNTER — Emergency Department (HOSPITAL_COMMUNITY): Payer: Medicare Other

## 2017-07-01 DIAGNOSIS — J45909 Unspecified asthma, uncomplicated: Secondary | ICD-10-CM | POA: Insufficient documentation

## 2017-07-01 DIAGNOSIS — R0789 Other chest pain: Secondary | ICD-10-CM | POA: Diagnosis not present

## 2017-07-01 DIAGNOSIS — I10 Essential (primary) hypertension: Secondary | ICD-10-CM | POA: Diagnosis not present

## 2017-07-01 DIAGNOSIS — Z79899 Other long term (current) drug therapy: Secondary | ICD-10-CM | POA: Insufficient documentation

## 2017-07-01 DIAGNOSIS — Z7982 Long term (current) use of aspirin: Secondary | ICD-10-CM | POA: Insufficient documentation

## 2017-07-01 DIAGNOSIS — Z87891 Personal history of nicotine dependence: Secondary | ICD-10-CM | POA: Diagnosis not present

## 2017-07-01 DIAGNOSIS — E119 Type 2 diabetes mellitus without complications: Secondary | ICD-10-CM | POA: Insufficient documentation

## 2017-07-01 LAB — CBC
HCT: 36.1 % (ref 36.0–46.0)
Hemoglobin: 11.9 g/dL — ABNORMAL LOW (ref 12.0–15.0)
MCH: 29.5 pg (ref 26.0–34.0)
MCHC: 33 g/dL (ref 30.0–36.0)
MCV: 89.6 fL (ref 78.0–100.0)
PLATELETS: 237 10*3/uL (ref 150–400)
RBC: 4.03 MIL/uL (ref 3.87–5.11)
RDW: 13.5 % (ref 11.5–15.5)
WBC: 5.7 10*3/uL (ref 4.0–10.5)

## 2017-07-01 LAB — BASIC METABOLIC PANEL
Anion gap: 11 (ref 5–15)
BUN: 15 mg/dL (ref 6–20)
CALCIUM: 9.1 mg/dL (ref 8.9–10.3)
CO2: 24 mmol/L (ref 22–32)
CREATININE: 0.65 mg/dL (ref 0.44–1.00)
Chloride: 106 mmol/L (ref 101–111)
Glucose, Bld: 128 mg/dL — ABNORMAL HIGH (ref 65–99)
Potassium: 3.5 mmol/L (ref 3.5–5.1)
SODIUM: 141 mmol/L (ref 135–145)

## 2017-07-01 LAB — D-DIMER, QUANTITATIVE: D-Dimer, Quant: 0.56 ug/mL-FEU — ABNORMAL HIGH (ref 0.00–0.50)

## 2017-07-01 LAB — I-STAT TROPONIN, ED: TROPONIN I, POC: 0 ng/mL (ref 0.00–0.08)

## 2017-07-01 MED ORDER — METHOCARBAMOL 500 MG PO TABS: 500.0000 mg | ORAL_TABLET | Freq: Two times a day (BID) | ORAL | 0 refills | Status: DC | PRN

## 2017-07-01 MED ORDER — HYDROCODONE-ACETAMINOPHEN 5-325 MG PO TABS: ORAL_TABLET | ORAL | 0 refills | Status: DC

## 2017-07-01 MED ORDER — HYDROCODONE-ACETAMINOPHEN 5-325 MG PO TABS
1.0000 | ORAL_TABLET | Freq: Once | ORAL | Status: AC
  Administered 2017-07-01: 1 via ORAL
  Filled 2017-07-01: qty 1

## 2017-07-01 MED ORDER — IOPAMIDOL (ISOVUE-370) INJECTION 76%
100.0000 mL | Freq: Once | INTRAVENOUS | Status: AC | PRN
  Administered 2017-07-01: 100 mL via INTRAVENOUS

## 2017-07-01 NOTE — ED Provider Notes (Signed)
[facility] EMERGENCY DEPARTMENT Provider Note   CSN: [id number] Arrival date & time: 07/01/17  4270     History   Chief Complaint Chief Complaint  Patient presents with  . Chest Pain    HPI Danielle [name] is a 76 y.o. female.  HPI  Pt was seen at 0740. Per pt, c/o gradual onset and persistence of constant right chest wall "pain" that began 2 days ago. Has been associated with pain in her right upper back and shoulder areas. Pt states her pain began after she "lifted a few weights." Pain worsens with palpation of the area and body position changes. Pt has taken one tylenol and 1 motrin 1 to 2 days ago without improvement. Denies direct injury, no palpitations, no SOB/cough, no abd pain, no N/V/D, no fevers, no rash, no calf/LE pain or unilateral swelling.   Past Medical History:  Diagnosis Date  . Anxiety   . Arthritis   . Asthma    seasonal; spring/fall  . Diabetes mellitus without complication ([place])   . Dyslipidemia   . Family history of early CAD    Dobutamine Cardiolite Study 04/03/2002--Ejection Fraction on post stress images is 76%  . GERD (gastroesophageal reflux disease)   . Glaucoma   . Hypertension   . Neutropenia, cyclic (HCC)    Benign, followed by hematology  . PONV (postoperative nausea and vomiting)   . Sleep apnea    CPAP at night  . Thrombocytopenia [facility])     Patient Active Problem List   Diagnosis Date Noted  . Intermittent palpitations 10/19/2016  . Chest pain with low risk for cardiac etiology 09/07/2015  . Encounter for annual physical exam 11/17/2014  . Essential hypertension 11/12/2013  . Hyperlipidemia 11/12/2013  . Hx of adenomatous colonic polyps 09/25/2013  . Esophageal dysphagia 09/25/2013  . GERD (gastroesophageal reflux disease) 09/25/2013  . Neutropenia ([place]) 03/29/2013    Past Surgical History:  Procedure Laterality Date  . COLONOSCOPY  08/09/2008     WCB:JSEGBT rectum, few right-sided diverticula, diminutive  polypoid  mucosa at the ileocecal valve status post cold biopsy removal Remainder of colonic mucosa appeared normal  . COLONOSCOPY, ESOPHAGOGASTRODUODENOSCOPY (EGD) AND ESOPHAGEAL DILATION N/A 09/29/2013   Procedure: COLONOSCOPY, ESOPHAGOGASTRODUODENOSCOPY (EGD) AND ESOPHAGEAL DILATION (ED);  Surgeon: Daneil Dolin, MD;  Location: [facility] ENDO SUITE;  Service: Endoscopy;  Laterality: N/A;  12:15  . KNEE ARTHROSCOPY  07/19/2011   Procedure: ARTHROSCOPY KNEE;  Surgeon: Sanjuana Kava;  Location: [facility] ORS;  Service: Orthopedics;  Laterality: Left;  partial medial menisectomy anterior horn, removal of loose body  . NM MYOVIEW LTD  09/2015   LOW RISK EF 66%, hyperdynamic. HTN response to exercise. NO EKG changes. No Ischemia or Infarct.    . TONSILLECTOMY  1972  . TRANSTHORACIC ECHOCARDIOGRAM  2003   EF normal, moderate asymmetric LVH; AV tri-leaflet, mild aortic sclerosis, mild mitral annular calcification, mild MR, mild TR  . [place], partial    OB History    No data available       Home Medications    Prior to Admission medications   Medication Sig Start Date End Date Taking? Authorizing Provider  albuterol (PROVENTIL HFA;VENTOLIN HFA) 108 (90 BASE) MCG/ACT inhaler Inhale 2 puffs into the lungs every 6 (six) hours as needed for wheezing. For shortness of breath    [provider]  ALOE VERA JUICE LIQD Take 1 Bottle by mouth daily.    [provider]  aspirin 81  MG chewable tablet Chew 1 tablet by mouth every evening.     [provider]  brimonidine (ALPHAGAN) 0.15 % ophthalmic solution Place 1 drop into both eyes 2 (two) times daily.      [provider]  Calcium-Vitamin D (CALTRATE 600 PLUS-VIT D PO) Take 1 tablet by mouth daily.      [provider]  Coenzyme Q10 (COQ-10) 100 MG CAPS Take 300 mg by mouth daily. 10/11/15   Leonie Man, MD  fluticasone (FLOVENT HFA) 110 MCG/ACT inhaler Inhale 1 puff into the lungs daily as needed  (FOR SHORTNESS OF BREATH). For shortness of breath    [provider]  hydrochlorothiazide (HYDRODIURIL) 25 MG tablet Take 1 tablet (25 mg total) by mouth daily. 10/18/16   Leonie Man, MD  hydrochlorothiazide (HYDRODIURIL) 25 MG tablet TAKE 1 TABLET BY MOUTH EVERY DAY 04/15/17   Leonie Man, MD  lansoprazole (PREVACID) 30 MG capsule TAKE 1 CAPSULE BY MOUTH EVERY DAY 03/29/16   Carlis Stable, NP  Multiple Vitamins-Minerals (CENTRUM SILVER PO) Take 1 capsule by mouth daily.    [provider]  NON FORMULARY Take by mouth daily as needed. Aloe vera juice    [provider]  NON FORMULARY at bedtime. Uses cpap at night    [provider]  Omega-3 Fatty Acids (FISH OIL) 1000 MG CAPS Take 1 capsule by mouth daily.    [provider]  Potassium 99 MG TABS Take 1 tablet by mouth daily as needed (SUPPLEMENT). Takes infrequently with no particular schedule    [provider]  simvastatin (ZOCOR) 40 MG tablet TAKE 1 TABLET(40 MG) BY MOUTH AT BEDTIME 12/08/15   Leonie Man, MD  Travoprost, BAK Free, (TRAVATAN) 0.004 % SOLN ophthalmic solution Place 1 drop into both eyes at bedtime.      [provider]  vitamin C (ASCORBIC ACID) 500 MG tablet Take 1 tablet by mouth daily.    [provider]    Family History Family History  Problem Relation Age of Onset  . Leukemia Mother   . Hyperlipidemia Mother   . Heart disease Father   . Prostate cancer Maternal Grandfather   . Kidney failure Paternal Grandmother   . Gastric cancer Paternal Grandmother   . Heart disease Paternal Grandfather   . Diabetes Brother   . Clotting disorder Brother        blood clots  . Hypotension Neg Hx   . Malignant hyperthermia Neg Hx   . Pseudochol deficiency Neg Hx   . Colon cancer Neg Hx     Social History Social History   Tobacco Use  . Smoking status: Former Smoker    Packs/day: 0.25    Years: 8.50    Pack years: 2.12    Types:  Cigarettes    Last attempt to quit: 07/15/1978    Years since quitting: 38.9  . Smokeless tobacco: Never Used  Substance Use Topics  . Alcohol use: Yes    Comment: occasional  . Drug use: No     Allergies   Trilyte [peg [id number]]   Review of Systems Review of Systems ROS: Statement: All systems negative except as marked or noted in the HPI; Constitutional: Negative for fever and chills. ; ; Eyes: Negative for eye pain, redness and discharge. ; ; ENMT: Negative for ear pain, hoarseness, nasal congestion, sinus pressure and sore throat. ; ; Cardiovascular: Negative for palpitations, diaphoresis, dyspnea and peripheral edema. ; ;  Respiratory: Negative for cough, wheezing and stridor. ; ; Gastrointestinal: Negative for nausea, vomiting, diarrhea, abdominal pain, blood in stool, hematemesis, jaundice and rectal bleeding. . ; ; Genitourinary: Negative for dysuria, flank pain and hematuria. ; ; Musculoskeletal: +chest wall pain, back pain. Negative for neck pain. Negative for swelling and trauma.; ; Skin: Negative for pruritus, rash, abrasions, blisters, bruising and skin lesion.; ; Neuro: Negative for headache, lightheadedness and neck stiffness. Negative for weakness, altered level of consciousness, altered mental status, extremity weakness, paresthesias, involuntary movement, seizure and syncope.      Physical Exam Updated Vital Signs BP 111/63   Pulse (!) 59   Temp 98.8 F (37.1 C) (Oral)   Resp 13   Ht 5\' 4"  (1.626 m)   Wt 77.1 kg (170 lb)   SpO2 100%   BMI 29.18 kg/m   Physical Exam 0745: Physical examination:  Nursing notes reviewed; Vital signs and O2 SAT reviewed;  Constitutional: Well developed, Well nourished, Well hydrated, In no acute distress; Head:  Normocephalic, atraumatic; Eyes: EOMI, PERRL, No scleral icterus; ENMT: Mouth and pharynx normal, Mucous membranes moist; Neck: Supple, Full range of motion, No lymphadenopathy; Cardiovascular: Regular rate and  rhythm, No gallop; Respiratory: Breath sounds clear & equal bilaterally, No wheezes.  Speaking full sentences with ease, Normal respiratory effort/excursion; Chest: +right anterior and lateral chest wall tender to palp. No rash, no deformity, no soft tissue crepitus. Movement normal; Abdomen: Soft, Nontender, Nondistended, Normal bowel sounds; Genitourinary: No CVA tenderness; Spine:  No midline CS, TS, LS tenderness.  +TTP right hypertonic trapezius muscle. +TTP right thoracic paraspinal muscles.;; Extremities: Pulses normal, No tenderness, No edema, No calf edema or asymmetry.; Neuro: AA&Ox3, Major CN grossly intact.  Speech clear. No gross focal motor or sensory deficits in extremities.; Skin: Color normal, Warm, Dry.    ED Treatments / Results  Labs (all labs ordered are listed, but only abnormal results are displayed)   EKG  EKG Interpretation  Date/Time:  Monday July 01 2017 06:52:44 EST Ventricular Rate:  73 PR Interval:    QRS Duration: 92 QT Interval:  394 QTC Calculation: 435 R Axis:   -55 Text Interpretation:  Sinus rhythm Left axis deviation Left anterior fascicular block Low voltage, precordial leads Abnormal R-wave progression, early transition When compared with ECG of 09/01/2015 No significant change was found Confirmed by Francine Graven [phone]) on 07/01/2017 8:15:53 AM       Radiology   Procedures Procedures (including critical care time)  Medications Ordered in ED Medications  HYDROcodone-acetaminophen (NORCO/VICODIN) 5-325 MG per tablet 1 tablet (1 tablet Oral Given 07/01/17 0810)     Initial Impression / Assessment and Plan / ED Course  I have reviewed the triage vital signs and the nursing notes.  Pertinent labs & imaging results that were available during my care of the patient were reviewed by me and considered in my medical decision making (see chart for details).  MDM Reviewed: previous chart, nursing note and vitals Reviewed previous: labs  and ECG Interpretation: labs, ECG and x-ray   Results for orders placed or performed during the hospital encounter of 81/01/75  Basic metabolic panel  Result Value Ref Range   Sodium 141 135 - 145 mmol/L   Potassium 3.5 3.5 - 5.1 mmol/L   Chloride 106 101 - 111 mmol/L   CO2 24 22 - 32 mmol/L   Glucose, Bld 128 (H) 65 - 99 mg/dL   BUN 15 6 - 20 mg/dL   Creatinine, Ser 0.65 0.44 -  1.00 mg/dL   Calcium 9.1 8.9 - 10.3 mg/dL   GFR calc non Af Amer >60 >60 mL/min   GFR calc Af Amer >60 >60 mL/min   Anion gap 11 5 - 15  CBC  Result Value Ref Range   WBC 5.7 4.0 - 10.5 K/uL   RBC 4.03 3.87 - 5.11 MIL/uL   Hemoglobin 11.9 (L) 12.0 - 15.0 g/dL   HCT 36.1 36.0 - 46.0 %   MCV 89.6 78.0 - 100.0 fL   MCH 29.5 26.0 - 34.0 pg   MCHC 33.0 30.0 - 36.0 g/dL   RDW 13.5 11.5 - 15.5 %   Platelets 237 150 - 400 K/uL  D-dimer, quantitative  Result Value Ref Range   D-Dimer, Quant 0.56 (H) 0.00 - 0.50 ug/mL-FEU  I-stat troponin, ED  Result Value Ref Range   Troponin i, poc 0.00 0.00 - 0.08 ng/mL   Comment 3           Dg Chest 2 View Result Date: 07/01/2017 CLINICAL DATA:  76 year old female with right shoulder pain radiating down the arm and chest pain following exercise 2 days ago. EXAM: CHEST  2 VIEW COMPARISON:  09/01/2015. FINDINGS: Lung volumes are stable and within normal limits. Mediastinal contours remain normal. Visualized tracheal air column is within normal limits. No pneumothorax, pulmonary edema, pleural effusion or confluent pulmonary opacity. Negative visible bowel gas pattern. Bone mineralization is within normal limits for age. Right shoulder structures appear stable and intact. No acute osseous abnormality identified. IMPRESSION: Stable since 2017 and normal for age. Electronically Signed   By: Genevie Ann M.D.   On: 07/01/2017 07:41   Ct Angio Chest Pe W/cm &/or Wo Cm Result Date: 07/01/2017 CLINICAL DATA:  76 year old female with right upper chest pain radiating to the neck and  shoulder for 2 days. Abnormal D-dimer. EXAM: CT ANGIOGRAPHY CHEST WITH CONTRAST TECHNIQUE: Multidetector CT imaging of the chest was performed using the standard protocol during bolus administration of intravenous contrast. Multiplanar CT image reconstructions and MIPs were obtained to evaluate the vascular anatomy. CONTRAST:  120mL ISOVUE-370 IOPAMIDOL (ISOVUE-370) INJECTION 76% COMPARISON:  Chest radiographs 0703 hours today, and earlier. FINDINGS: Cardiovascular: Good contrast bolus timing in the pulmonary arterial tree. No focal filling defect identified in the pulmonary arteries to suggest acute pulmonary embolism. Mild cardiomegaly. No pericardial effusion. Negative thoracic aorta. No calcified coronary artery atherosclerosis is evident. Mediastinum/Nodes: Negative visible thoracic inlet. No mediastinal or hilar lymphadenopathy. Lungs/Pleura: Major airways are patent. Occasional tiny subpleural nodules in the lungs appear postinflammatory and benign. Mild dependent lower lobe atelectasis. No pleural effusion or other abnormal pulmonary opacity. Upper Abdomen: Negative visualized liver, gallbladder, spleen, pancreas, adrenal glands, kidneys, and bowel in the upper abdomen. Musculoskeletal: Negative. Review of the MIP images confirms the above findings. IMPRESSION: 1.  Negative for acute pulmonary embolus.  Negative visible aorta. 2. Mild cardiomegaly.  Mild pulmonary atelectasis. Electronically Signed   By: Genevie Ann M.D.   On: 07/01/2017 10:49    1115:  Doubt PE as cause for symptoms with negative CT-A chest and low risk [place].  Doubt ACS as cause for symptoms with normal troponin and unchanged EKG from previous after 2 days of constant atypical symptoms. Tx symptomatically at this time. Dx and testing d/w pt and family.  Questions answered.  Verb understanding, agreeable to d/c home with outpt f/u.     Final Clinical Impressions(s) / ED Diagnoses   Final diagnoses:  None    ED Discharge Orders  None       Francine Graven, DO 07/03/17 1909

## 2017-07-01 NOTE — Discharge Instructions (Signed)
Take the prescriptions as directed.  Apply moist heat or ice to the area(s) of discomfort, for 15 minutes at a time, several times per day for the next few days.  Do not fall asleep on a heating or ice pack.  Call your regular medical doctor on Wednesday to schedule a follow up appointment this week.  Return to the Emergency Department immediately if worsening.

## 2017-07-01 NOTE — ED Triage Notes (Signed)
Pt states she started having right sided chest pain, right shoulder, and neck pain on Saturday night.

## 2017-07-11 ENCOUNTER — Ambulatory Visit (INDEPENDENT_AMBULATORY_CARE_PROVIDER_SITE_OTHER): Payer: Medicare Other

## 2017-07-11 ENCOUNTER — Encounter: Payer: Self-pay | Admitting: Orthopaedic Surgery

## 2017-07-11 ENCOUNTER — Ambulatory Visit: Payer: Medicare Other | Admitting: Orthopaedic Surgery

## 2017-07-11 VITALS — BP 97/63 | HR 68 | Temp 97.7°F | Ht 67.0 in | Wt 174.0 lb

## 2017-07-11 DIAGNOSIS — M542 Cervicalgia: Secondary | ICD-10-CM

## 2017-07-11 DIAGNOSIS — G8929 Other chronic pain: Secondary | ICD-10-CM | POA: Diagnosis not present

## 2017-07-11 DIAGNOSIS — M25511 Pain in right shoulder: Secondary | ICD-10-CM

## 2017-07-11 MED ORDER — PREDNISONE 5 MG (21) PO TBPK: ORAL_TABLET | ORAL | 0 refills | Status: DC

## 2017-07-11 NOTE — Progress Notes (Signed)
Subjective:    Patient ID: Danielle [name], female    DOB: [birth year], 77 y.o.   MRN: [id number]  HPI She has complained of pain in the right shoulder since before Christmas.  On Christmas Eve she went to the ER with shoulder pain and chest pain.  She was evaluated for the chest pain.  It was negative.  She continues with shoulder pain.  She has taken Tylenol and took Advil.  She does not like to take medicine.  She has no trauma but she works out at [employer].  She has had numbness to the ring and little finger and sometimes to the long finger on the right.  In further discussing with her, it seems the shoulder hurts after the pain runs down to the hand and fingers.  She has had problems with the right wrist as well with swelling at times.  She had x-rays done in September for this which were negative.   Review of Systems  HENT: Negative for congestion.   Respiratory: Positive for shortness of breath. Negative for cough.   Cardiovascular: Negative for chest pain and leg swelling.  Musculoskeletal: Positive for arthralgias and neck pain.  Psychiatric/Behavioral: The patient is nervous/anxious.   All other systems reviewed and are negative.  Past Medical History:  Diagnosis Date  . Anxiety   . Arthritis   . Asthma    seasonal; spring/fall  . Diabetes mellitus without complication ([place])   . Dyslipidemia   . Family history of early CAD    Dobutamine Cardiolite Study 04/03/2002--Ejection Fraction on post stress images is 76%  . GERD (gastroesophageal reflux disease)   . Glaucoma   . Hypertension   . Neutropenia, cyclic (HCC)    Benign, followed by hematology  . PONV (postoperative nausea and vomiting)   . Sleep apnea    CPAP at night  . Thrombocytopenia ([place])     Past Surgical History:  Procedure Laterality Date  . COLONOSCOPY  08/09/2008     BJS:EGBTDV rectum, few right-sided diverticula, diminutive  polypoid mucosa at the ileocecal valve status post cold biopsy removal Remainder  of colonic mucosa appeared normal  . COLONOSCOPY, ESOPHAGOGASTRODUODENOSCOPY (EGD) AND ESOPHAGEAL DILATION N/A 09/29/2013   Procedure: COLONOSCOPY, ESOPHAGOGASTRODUODENOSCOPY (EGD) AND ESOPHAGEAL DILATION (ED);  Surgeon: Daneil Dolin, MD;  Location: [facility] ENDO SUITE;  Service: Endoscopy;  Laterality: N/A;  12:15  . KNEE ARTHROSCOPY  07/19/2011   Procedure: ARTHROSCOPY KNEE;  Surgeon: Sanjuana Kava;  Location: [facility] ORS;  Service: Orthopedics;  Laterality: Left;  partial medial menisectomy anterior horn, removal of loose body  . KNEE SURGERY    . NM MYOVIEW LTD  09/2015   LOW RISK EF 66%, hyperdynamic. HTN response to exercise. NO EKG changes. No Ischemia or Infarct.    . TONSILLECTOMY  1972  . TRANSTHORACIC ECHOCARDIOGRAM  2003   EF normal, moderate asymmetric LVH; AV tri-leaflet, mild aortic sclerosis, mild mitral annular calcification, mild MR, mild TR  . [place], partial    Current Outpatient Medications on File Prior to Visit  Medication Sig Dispense Refill  . albuterol (PROVENTIL HFA;VENTOLIN HFA) 108 (90 BASE) MCG/ACT inhaler Inhale 2 puffs into the lungs every 6 (six) hours as needed for wheezing. For shortness of breath    . ALOE VERA JUICE LIQD Take 1 Bottle by mouth daily.    Marland Kitchen aspirin 81 MG chewable tablet Chew 1 tablet by mouth every evening.     . brimonidine (ALPHAGAN) 0.15 %  ophthalmic solution Place 1 drop into both eyes 2 (two) times daily.      . Calcium-Vitamin D (CALTRATE 600 PLUS-VIT D PO) Take 1 tablet by mouth daily.      . Coenzyme Q10 (COQ-10) 100 MG CAPS Take 300 mg by mouth daily. 90 each PRN  . fluticasone (FLOVENT HFA) 110 MCG/ACT inhaler Inhale 1 puff into the lungs daily as needed (FOR SHORTNESS OF BREATH). For shortness of breath    . hydrochlorothiazide (HYDRODIURIL) 25 MG tablet Take 1 tablet (25 mg total) by mouth daily. 90 tablet 3  . hydrochlorothiazide (HYDRODIURIL) 25 MG tablet TAKE 1 TABLET BY MOUTH EVERY DAY 90 tablet 0  .  HYDROcodone-acetaminophen (NORCO/VICODIN) 5-325 MG tablet 1 tab PO q12 hours prn pain 8 tablet 0  . lansoprazole (PREVACID) 30 MG capsule TAKE 1 CAPSULE BY MOUTH EVERY DAY 30 capsule 0  . methocarbamol (ROBAXIN) 500 MG tablet Take 1 tablet (500 mg total) by mouth 2 (two) times daily as needed for muscle spasms. 10 tablet 0  . Multiple Vitamins-Minerals (CENTRUM SILVER PO) Take 1 capsule by mouth daily.    . NON FORMULARY Take by mouth daily as needed. Aloe vera juice    . NON FORMULARY at bedtime. Uses cpap at night    . Omega-3 Fatty Acids (FISH OIL) 1000 MG CAPS Take 1 capsule by mouth daily.    . Potassium 99 MG TABS Take 1 tablet by mouth daily as needed (SUPPLEMENT). Takes infrequently with no particular schedule    . simvastatin (ZOCOR) 40 MG tablet TAKE 1 TABLET(40 MG) BY MOUTH AT BEDTIME 90 tablet 3  . Travoprost, BAK Free, (TRAVATAN) 0.004 % SOLN ophthalmic solution Place 1 drop into both eyes at bedtime.      . vitamin C (ASCORBIC ACID) 500 MG tablet Take 1 tablet by mouth daily.     No current facility-administered medications on file prior to visit.     Social History   Socioeconomic History  . Marital status: Married    Spouse name: Not on file  . Number of children: 1  . Years of education: 50  . Highest education level: Not on file  Social Needs  . Financial resource strain: Not on file  . Food insecurity - worry: Not on file  . Food insecurity - inability: Not on file  . Transportation needs - medical: Not on file  . Transportation needs - non-medical: Not on file  Occupational History  . Occupation: Retired Science writer: RETIRED  Tobacco Use  . Smoking status: Former Smoker    Packs/day: 0.25    Years: 8.50    Pack years: 2.12    Types: Cigarettes    Last attempt to quit: 07/15/1978    Years since quitting: 39.0  . Smokeless tobacco: Never Used  Substance and Sexual Activity  . Alcohol use: Yes    Comment: occasional  . Drug use: No  . Sexual  activity: Not on file  Other Topics Concern  . Not on file  Social History Narrative  . Not on file    Family History  Problem Relation Age of Onset  . Leukemia Mother   . Hyperlipidemia Mother   . Heart disease Father   . Prostate cancer Maternal Grandfather   . Kidney failure Paternal Grandmother   . Gastric cancer Paternal Grandmother   . Heart disease Paternal Grandfather   . Diabetes Brother   . Clotting disorder Brother  blood clots  . Hypotension Neg Hx   . Malignant hyperthermia Neg Hx   . Pseudochol deficiency Neg Hx   . Colon cancer Neg Hx     BP 97/63   Pulse 68   Temp 97.7 F (36.5 C)   Ht 5\' 7"  (1.702 m)   Wt 174 lb (78.9 kg)   BMI 27.25 kg/m       Objective:   Physical Exam  Constitutional: She is oriented to person, place, and time. She appears well-developed and well-nourished.  HENT:  Head: Normocephalic and atraumatic.  Eyes: Conjunctivae and EOM are normal. Pupils are equal, round, and reactive to light.  Neck: Normal range of motion. Neck supple.  Cardiovascular: Normal rate, regular rhythm and intact distal pulses.  Pulmonary/Chest: Effort normal.  Abdominal: Soft.  Musculoskeletal: She exhibits tenderness (She has full ROM of the right shoulder, no effusion, full ROM of the neck and left shoulder.  She has no numbness today and full NV status.  Grips are good.).  Neurological: She is alert and oriented to person, place, and time. She displays normal reflexes. No cranial nerve deficit. She exhibits normal muscle tone. Coordination normal.  Skin: Skin is warm and dry.  Psychiatric: She has a normal mood and affect. Her behavior is normal. Judgment and thought content normal.    X-rays were done of the cervical spine, reported separately.      Assessment & Plan:   Encounter Diagnoses  Name Primary?  . Cervicalgia Yes  . Chronic right shoulder pain    She has narrowing of the cervical spine at C5-C6.  I will give prednisone dose  pack for her to see if she gets a good response.  She will most likely need MRI of the neck.  Return in two weeks.  Call if any problem.  Precautions discussed.   Electronically Signed Sanjuana Kava, MD 1/3/20199:00 AM

## 2017-07-25 ENCOUNTER — Ambulatory Visit: Payer: Medicare Other | Admitting: Orthopaedic Surgery

## 2017-08-09 ENCOUNTER — Other Ambulatory Visit: Payer: Self-pay | Admitting: Cardiology

## 2017-08-09 ENCOUNTER — Encounter (INDEPENDENT_AMBULATORY_CARE_PROVIDER_SITE_OTHER): Payer: Self-pay

## 2017-08-09 ENCOUNTER — Ambulatory Visit: Payer: Medicare Other | Admitting: Cardiology

## 2017-08-09 VITALS — BP 100/60 | HR 55 | Ht 65.0 in | Wt 175.4 lb

## 2017-08-09 DIAGNOSIS — E782 Mixed hyperlipidemia: Secondary | ICD-10-CM | POA: Diagnosis not present

## 2017-08-09 DIAGNOSIS — H34213 Partial retinal artery occlusion, bilateral: Secondary | ICD-10-CM | POA: Diagnosis not present

## 2017-08-09 DIAGNOSIS — R079 Chest pain, unspecified: Secondary | ICD-10-CM

## 2017-08-09 DIAGNOSIS — R002 Palpitations: Secondary | ICD-10-CM | POA: Diagnosis not present

## 2017-08-09 DIAGNOSIS — I10 Essential (primary) hypertension: Secondary | ICD-10-CM

## 2017-08-09 NOTE — Patient Instructions (Addendum)
NO MEDICATION CHANGES   SCHEDULE AT [place] Your physician has requested that you have a carotid duplex. This test is an ultrasound of the carotid arteries in your neck. It looks at blood flow through these arteries that supply the brain with blood. Allow one hour for this exam. There are no restrictions or special instructions.  AND  Your physician has requested that you have an exercise tolerance test. For further information please visit [email or web address]. Please also follow instruction sheet, as given.    Your physician recommends that you schedule a follow-up appointment in [place].

## 2017-08-09 NOTE — Progress Notes (Signed)
PCP: Sharilyn Sites, MD  Clinic Note: Chief Complaint  Patient presents with  . Follow-up    chest-shoulder pain (ER visit).  Eye plaque    HPI: Danielle [name] is a 77 y.o. female with a PMH below who presents today for earlier than usual annual follow-up due to recent ER visit for chest pain.  She was initially evaluated for chest discomfort given family history of CAD. For the most part, we have been managing blood pressure.    She was evaluated with a Myoview stress test spring 2017 this was done for follow-up evaluation of chest pain.  Danielle [name] was last seen in April 2018.  She was back to her regular workout regimen.  Only noting a few occasional "fluttering" sensations in her chest off and on lasting less than a minute.  These are chronic symptoms that have been going on for years and not some to her.  Otherwise asymptomatic.   Recent Hospitalizations: She went to the emergency room on December 24 with chest wall pain.  Studies Reviewed: No new studies.  Interval History: Danielle [name] returns today overall noticing that she still has some episodes of chest tightness that can come and go, usually occurs at night.  Not with exertion.  She made a little bit short of breath going up and down stairs, because she is a little bit out of sorts with her exercise regimen and is now out of shape. She has been out of sorts with a foot injury - therefore, sporadic visits to the gym - not doing her.workout regimen ~3 days/wk at the fitness center doing cycling, TM & various stretching exercises before light weights.    As for the emergency room visit back in December, she notes that she had injured her shoulder at the gym, and was taking some muscle relaxant type medicines as a result of it.  But at that time she had swelling from her shoulder down across her chest, and she was having some sternal type chest discomfort.  She was evaluated with CT angiogram that showed no evidence of PE  and no evidence of coronary artery calcification either.  No thoracic aortic calcification.  The pain was at rest, not exertional.  She was told by her eye doctor that she has "plaque" in her eyes that are concerning for blockages in the neck. She has not had any TIA or amaurosis fugax symptoms.  No syncope or near syncope. No recurrence of her fluttering sensations, no rapid heartbeat/palpitations.   No PND, orthopnea or edema.  No claudication..  ROS: A comprehensive was performed.  Pertinent + above.  Review of Systems  Constitutional: Negative for malaise/fatigue (Just tires out a little easier than usual - not concerned).       She indicates that she has been out of shape  HENT: Negative for nosebleeds.   Respiratory: Negative for cough, shortness of breath (Going up and down stairs rapidly, or with heavy exertion.) and wheezing.   Cardiovascular: Negative for palpitations.  Gastrointestinal: Negative for blood in stool, heartburn and melena.  Genitourinary: Negative for hematuria.  Musculoskeletal: Negative for joint pain (Still has some pain in the right shoulder that sometimes radiates across the chest) and myalgias.  Neurological: Negative for dizziness, seizures and loss of consciousness.  Endo/Heme/Allergies: Negative for environmental allergies. Does not bruise/bleed easily.  Psychiatric/Behavioral: The patient is not nervous/anxious.   All other systems reviewed and are negative.   Past Medical History:  Diagnosis Date  .  Anxiety   . Arthritis   . Asthma    seasonal; spring/fall  . Diabetes mellitus without complication ([place])   . Dyslipidemia   . Family history of early CAD    Dobutamine Cardiolite Study 04/03/2002--Ejection Fraction on post stress images is 76%  . GERD (gastroesophageal reflux disease)   . Glaucoma   . Hypertension   . Neutropenia, cyclic (HCC)    Benign, followed by hematology  . PONV (postoperative nausea and vomiting)   . Sleep apnea    CPAP at  night  . Thrombocytopenia ([place])     Past Surgical History:  Procedure Laterality Date  . COLONOSCOPY  08/09/2008     WHQ:PRFFMB rectum, few right-sided diverticula, diminutive  polypoid mucosa at the ileocecal valve status post cold biopsy removal Remainder of colonic mucosa appeared normal  . COLONOSCOPY, ESOPHAGOGASTRODUODENOSCOPY (EGD) AND ESOPHAGEAL DILATION N/A 09/29/2013   Procedure: COLONOSCOPY, ESOPHAGOGASTRODUODENOSCOPY (EGD) AND ESOPHAGEAL DILATION (ED);  Surgeon: Daneil Dolin, MD;  Location: [facility];  Service: Endoscopy;  Laterality: N/A;  12:15  . KNEE ARTHROSCOPY  07/19/2011   Procedure: ARTHROSCOPY KNEE;  Surgeon: Sanjuana Kava;  Location: [facility];  Service: Orthopedics;  Laterality: Left;  partial medial menisectomy anterior horn, removal of loose body  . KNEE SURGERY    . NM MYOVIEW LTD  09/2015   LOW RISK EF 66%, hyperdynamic. HTN response to exercise. NO EKG changes. No Ischemia or Infarct.    . TONSILLECTOMY  1972  . TRANSTHORACIC ECHOCARDIOGRAM  2003   EF normal, moderate asymmetric LVH; AV tri-leaflet, mild aortic sclerosis, mild mitral annular calcification, mild MR, mild TR  . [place], partial     Current Meds  Medication Sig  . albuterol (PROVENTIL HFA;VENTOLIN HFA) 108 (90 BASE) MCG/ACT inhaler Inhale 2 puffs into the lungs every 6 (six) hours as needed for wheezing. For shortness of breath  . ALOE VERA JUICE LIQD Take 1 Bottle by mouth daily.  Marland Kitchen aspirin 81 MG chewable tablet Chew 1 tablet by mouth every evening.   . brimonidine (ALPHAGAN) 0.15 % ophthalmic solution Place 1 drop into both eyes 2 (two) times daily.    . Cholecalciferol (VITAMIN D3) 1000 units CAPS Take by mouth.  . fluticasone (FLOVENT HFA) 110 MCG/ACT inhaler Inhale 1 puff into the lungs daily as needed (FOR SHORTNESS OF BREATH). For shortness of breath  . hydrochlorothiazide (HYDRODIURIL) 25 MG tablet Take 1 tablet (25 mg total) by mouth daily.  . Multiple  Vitamins-Minerals (CENTRUM SILVER PO) Take 1 capsule by mouth daily.  . NON FORMULARY Take by mouth daily as needed. Aloe vera juice  . NON FORMULARY at bedtime. Uses cpap at night  . Omega-3 Fatty Acids (FISH OIL) 1000 MG CAPS Take 1 capsule by mouth daily.  . Potassium 99 MG TABS Take 1 tablet by mouth daily as needed (SUPPLEMENT). Takes infrequently with no particular schedule  . simvastatin (ZOCOR) 40 MG tablet TAKE 1 TABLET(40 MG) BY MOUTH AT BEDTIME  . Travoprost, BAK Free, (TRAVATAN) 0.004 % SOLN ophthalmic solution Place 1 drop into both eyes at bedtime.    . vitamin C (ASCORBIC ACID) 500 MG tablet Take 1 tablet by mouth daily.    Allergies  Allergen Reactions  . Trilyte [Peg 3350-Kcl-Na Bicarb-Nacl] Swelling    Swelling of tongue    Social History   Socioeconomic History  . Marital status: Married    Spouse name: None  . Number of children: 1  . Years  of education: 16  . Highest education level: None  Social Needs  . Financial resource strain: None  . Food insecurity - worry: None  . Food insecurity - inability: None  . Transportation needs - medical: None  . Transportation needs - non-medical: None  Occupational History  . Occupation: Retired Science writer: RETIRED  Tobacco Use  . Smoking status: Former Smoker    Packs/day: 0.25    Years: 8.50    Pack years: 2.12    Types: Cigarettes    Last attempt to quit: 07/15/1978    Years since quitting: 39.1  . Smokeless tobacco: Never Used  Substance and Sexual Activity  . Alcohol use: Yes    Comment: occasional  . Drug use: No  . Sexual activity: None  Other Topics Concern  . None  Social History Narrative  . None   Family History family history includes Clotting disorder in her brother; Diabetes in her brother; Gastric cancer in her paternal grandmother; Heart disease in her father and paternal grandfather; Hyperlipidemia in her mother; Kidney failure in her paternal grandmother; Leukemia in her mother;  Prostate cancer in her maternal grandfather.  Wt Readings from Last 3 Encounters:  08/09/17 175 lb 6.4 oz (79.6 kg)  07/11/17 174 lb (78.9 kg)  07/01/17 170 lb (77.1 kg)    PHYSICAL EXAM BP 100/60   Pulse (!) 55   Ht 5\' 5"  (1.651 m)   Wt 175 lb 6.4 oz (79.6 kg)   BMI 29.19 kg/m   Physical Exam  Constitutional: She is oriented to person, place, and time. She appears well-developed and well-nourished.  HENT:  Head: Normocephalic and atraumatic.  Eyes: EOM are normal.  Neck: No hepatojugular reflux and no JVD present. Carotid bruit is not present.  Cardiovascular: Normal rate, regular rhythm, normal heart sounds and intact distal pulses.  No extrasystoles are present. PMI is not displaced. Exam reveals no gallop and no friction rub.  No murmur heard. Pulmonary/Chest: Effort normal and breath sounds normal. No respiratory distress. She has no wheezes. She has no rales.  She does have mild tenderness along the sternal border, and in the mid sternum roughly at the fourth rib space.  Abdominal: Soft. Bowel sounds are normal. She exhibits no distension. There is no tenderness. There is no rebound.  Musculoskeletal: Normal range of motion.  Neurological: She is alert and oriented to person, place, and time.  Psychiatric: She has a normal mood and affect. Her behavior is normal. Judgment and thought content normal.  Nursing note and vitals reviewed.   Adult ECG Report  Rate: 55;  Rhythm: sinus bradycardia; LAFB (-49). ~ Anterior MI, age undetermined (poor R wave progression).  Narrative Interpretation: Stable EKG.   Other studies Reviewed: Additional studies/ records that were reviewed today include:  Recent Labs:  Labs are followed by PCP   No results found for: CHOL, HDL, LDLCALC, LDLDIRECT, TRIG, CHOLHDL Lab Results  Component Value Date   CREATININE 0.65 07/01/2017   BUN 15 07/01/2017   NA 141 07/01/2017   K 3.5 07/01/2017   CL 106 07/01/2017   CO2 24 07/01/2017   No  results found for: CHOL, HDL, LDLCALC, LDLDIRECT, TRIG, CHOLHDL - PCP just reduced from 20 mg to10 mg simvastatin based upon labs   ASSESSMENT / PLAN: Problem List Items Addressed This Visit    Chest pain with low risk for cardiac etiology    Very atypical sounding chest discomfort.  Not exertional.  Probably related to  whatever she did do her she has not had any more episodes like she had in the emergency room.  Pain is somewhat reproducible on exam.  However, she has had some persistent symptoms and therefore we will evaluate with a GXT/ETT      Relevant Orders   EKG 12-Lead   EXERCISE TOLERANCE TEST (ETT)   Essential hypertension (Chronic)    On HCTZ alone.  Borderline hypotensive.  I am not sure how much she truly has hypertension.  Continue to monitor.      Hollenhorst plaque, both eyes    Ophthalmologist apparently saw some plaque in her eyes.  Not sure what the details were.  Most likely consistent with Hollenhorst plaque. Check carotid Dopplers.      Hyperlipidemia (Chronic)    On statin.  Followed by PCP.  If indeed she does have a Hollenhorst plaque.  Would recommend more aggressive line of targeting LDL less than 100.      Intermittent palpitations - Primary (Chronic)    Fairly well controlled.  Not much of an issue at present.  If they recur, could consider AV nodal agent, however she has baseline bradycardia making that less of a role option.  Would prefer not to treat.      Relevant Orders   EKG 12-Lead   EXERCISE TOLERANCE TEST (ETT)      Current medicines are reviewed at length with the patient today. (+/- concerns) n/a The following changes have been made: -->   Patient Instructions  NO MEDICATION CHANGES    SCHEDULE AT [facility] has requested that you have a carotid duplex. This test is an ultrasound of the carotid arteries in your neck. It looks at blood flow through these arteries that supply the brain with blood.  Allow one hour for this exam. There are no restrictions or special instructions.  AND  Your physician has requested that you have an exercise tolerance test. For further information please visit [email or web address]. Please also follow instruction sheet, as given.     Your physician recommends that you schedule a follow-up appointment in [place].    Studies Ordered:   Orders Placed This Encounter  Procedures  . EXERCISE TOLERANCE TEST (ETT)  . EKG 12-Lead      Glenetta Hew, M.D., M.S. Interventional Cardiologist   Pager # [phone] Phone # [phone] [address] [place], [place] [address]

## 2017-08-11 ENCOUNTER — Encounter: Payer: Self-pay | Admitting: Cardiology

## 2017-08-11 NOTE — Assessment & Plan Note (Signed)
Very atypical sounding chest discomfort.  Not exertional.  Probably related to whatever she did do her she has not had any more episodes like she had in the emergency room.  Pain is somewhat reproducible on exam.  However, she has had some persistent symptoms and therefore we will evaluate with a GXT/ETT

## 2017-08-11 NOTE — Assessment & Plan Note (Signed)
Ophthalmologist apparently saw some plaque in her eyes.  Not sure what the details were.  Most likely consistent with Hollenhorst plaque. Check carotid Dopplers.

## 2017-08-11 NOTE — Assessment & Plan Note (Signed)
Fairly well controlled.  Not much of an issue at present.  If they recur, could consider AV nodal agent, however she has baseline bradycardia making that less of a role option.  Would prefer not to treat.

## 2017-08-11 NOTE — Assessment & Plan Note (Signed)
On HCTZ alone.  Borderline hypotensive.  I am not sure how much she truly has hypertension.  Continue to monitor.

## 2017-08-11 NOTE — Assessment & Plan Note (Signed)
On statin.  Followed by PCP.  If indeed she does have a Hollenhorst plaque.  Would recommend more aggressive line of targeting LDL less than 100.

## 2017-08-16 ENCOUNTER — Telehealth (HOSPITAL_COMMUNITY): Payer: Self-pay

## 2017-08-16 NOTE — Telephone Encounter (Signed)
Encounter complete. 

## 2017-08-21 ENCOUNTER — Ambulatory Visit (HOSPITAL_COMMUNITY)
Admission: RE | Admit: 2017-08-21 | Payer: Medicare Other | Source: Ambulatory Visit | Attending: Cardiology | Admitting: Cardiology

## 2017-08-21 ENCOUNTER — Ambulatory Visit (HOSPITAL_BASED_OUTPATIENT_CLINIC_OR_DEPARTMENT_OTHER)
Admission: RE | Admit: 2017-08-21 | Discharge: 2017-08-21 | Disposition: A | Discharge status: Home / Self Care | Payer: Medicare Other | Source: Ambulatory Visit | Attending: Cardiology | Admitting: Cardiology

## 2017-08-21 ENCOUNTER — Ambulatory Visit (HOSPITAL_COMMUNITY)
Admission: RE | Admit: 2017-08-21 | Discharge: 2017-08-21 | Disposition: A | Discharge status: Home / Self Care | Payer: Medicare Other | Source: Ambulatory Visit | Attending: Cardiovascular Disease | Admitting: Cardiovascular Disease

## 2017-08-21 DIAGNOSIS — R079 Chest pain, unspecified: Secondary | ICD-10-CM

## 2017-08-21 DIAGNOSIS — R002 Palpitations: Secondary | ICD-10-CM | POA: Insufficient documentation

## 2017-08-21 DIAGNOSIS — H34213 Partial retinal artery occlusion, bilateral: Secondary | ICD-10-CM | POA: Insufficient documentation

## 2017-08-21 LAB — EXERCISE TOLERANCE TEST
CHL CUP MPHR: 146 {beats}/min
CHL RATE OF PERCEIVED EXERTION: 17
CSEPEDS: 59 s
CSEPEW: 10.1 METS
CSEPPHR: 169 {beats}/min
Exercise duration (min): 8 min
Percent HR: 115 %
Rest HR: 61 {beats}/min

## 2017-08-22 ENCOUNTER — Telehealth: Payer: Self-pay | Admitting: *Deleted

## 2017-08-22 NOTE — Telephone Encounter (Signed)
Follow up    Patient is returning call in reference to her GXT. Please call to discuss.

## 2017-08-22 NOTE — Telephone Encounter (Signed)
-----   Message from Leonie Man, MD sent at 08/21/2017  9:51 PM EST ----- Good job on the treadmill stress test.  Good exercise tolerance making it 8 minutes. -There were no EKG changes to suggest issues with heart artery disease.  And there was no sensation of chest discomfort or pressure to suggest angina symptoms.  At a good workload with no symptoms or EKG changes, this is low risk.  Glenetta Hew, MD  Please forward to PCP: Sharilyn Sites, MD

## 2017-08-22 NOTE — Telephone Encounter (Signed)
LEFT MESSAGE TO CALL BACK ABOUT  GXT RESULT

## 2017-08-22 NOTE — Telephone Encounter (Signed)
LEFT MESSAGE WILL CALL BACK

## 2017-08-23 NOTE — Telephone Encounter (Signed)
Spoke to patient. Result given . Verbalized understanding  

## 2017-09-10 ENCOUNTER — Other Ambulatory Visit: Payer: Self-pay | Admitting: Cardiology

## 2017-09-10 MED ORDER — HYDROCHLOROTHIAZIDE 25 MG PO TABS: 25.0000 mg | ORAL_TABLET | Freq: Every day | ORAL | 0 refills | Status: DC

## 2017-09-10 NOTE — Telephone Encounter (Signed)
New Message    *STAT* If patient is at the pharmacy, call can be transferred to refill team.   1. Which medications need to be refilled? (please list name of each medication and dose if known) hydrochlorothiazide (HYDRODIURIL) 25 MG tablet  2. Which pharmacy/location (including street and city if local pharmacy) is medication to be sent to? [facility]  3. Do they need a 30 day or 90 day supply? 90 day

## 2017-09-12 ENCOUNTER — Ambulatory Visit: Payer: Medicare Other | Admitting: Cardiology

## 2017-09-13 ENCOUNTER — Ambulatory Visit: Payer: Medicare Other | Admitting: Cardiology

## 2017-10-22 ENCOUNTER — Ambulatory Visit: Payer: Medicare Other | Admitting: Cardiology

## 2017-10-22 ENCOUNTER — Encounter: Payer: Self-pay | Admitting: Cardiology

## 2017-10-22 VITALS — BP 112/68 | HR 71 | Ht 64.0 in | Wt 177.0 lb

## 2017-10-22 DIAGNOSIS — H34213 Partial retinal artery occlusion, bilateral: Secondary | ICD-10-CM | POA: Diagnosis not present

## 2017-10-22 DIAGNOSIS — R002 Palpitations: Secondary | ICD-10-CM | POA: Diagnosis not present

## 2017-10-22 DIAGNOSIS — I10 Essential (primary) hypertension: Secondary | ICD-10-CM

## 2017-10-22 DIAGNOSIS — R079 Chest pain, unspecified: Secondary | ICD-10-CM

## 2017-10-22 DIAGNOSIS — E782 Mixed hyperlipidemia: Secondary | ICD-10-CM | POA: Diagnosis not present

## 2017-10-22 MED ORDER — SIMVASTATIN 40 MG PO TABS: ORAL_TABLET | ORAL | 3 refills | Status: DC

## 2017-10-22 NOTE — Assessment & Plan Note (Signed)
Relatively normal carotid Dopplers.  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  Would recommend targeting HDL level that is slightly lower than it currently is. Plan: Increase simvastatin back to 40 mg daily.  Target LDL between 70-100.  With goal < 70.

## 2017-10-22 NOTE — Assessment & Plan Note (Signed)
No further complaints.  Not on therapy.

## 2017-10-22 NOTE — Patient Instructions (Addendum)
MEDICATION INSTRUCTION  INCREASE TO SIMVASTATIN 40 MG  ( GO BACK TO REGULAR DOSE BEFORE YOU DECREASE)  NO OTHER CHANGES    Your physician wants you to follow-up in [place]. You will receive a reminder letter in the mail two months in advance. If you don't receive a letter, please call our office to schedule the follow-up appointment.    If you need a refill on your cardiac medications before your next appointment, please call your pharmacy.

## 2017-10-22 NOTE — Assessment & Plan Note (Signed)
As noted.  Goal LDL should be less than 100.  Will increase simvastatin to 40 mg.

## 2017-10-22 NOTE — Assessment & Plan Note (Signed)
Blood pressure doing well today on HCTZ alone.

## 2017-10-22 NOTE — Progress Notes (Signed)
PCP: Sharilyn Sites, MD  Clinic Note: Chief Complaint  Patient presents with  . Follow-up    1 months post testing    HPI: Danielle [name] is a 77 y.o. female with a PMH below who presents today for earlier than usual annual follow-up due to recent ER visit for chest pain.  She was initially evaluated for chest discomfort given family history of CAD. For the most part, we have been managing blood pressure.    She was evaluated with a Myoview stress test spring 2017 this was done for follow-up evaluation of chest pain.  Danielle [name] was last seen in Feb 2019 in hospital f/u --> She went to the emergency room on December 24 with chest wall pain --actually described as right-sided shoulder and neck pain.  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  She was evaluated with a ETT and carotid Dopplers.  Studies Reviewed:   ETT: Normal blood pressure response to exercise.  No EKG changes.  Worked for 10.1 METs adequate heart rate response.  In 115% MPHR.  LOW RISK.  No EKG changes.  Carotid Dopplers: Less than 50% stenosis on the right.  Nothing on the left.   Interval History: Danielle [name] returns today overall feeling much better.  She is not having any more that right-sided chest discomfort and is back to her routine exercise.  Not quite back to her normal regimen but is getting closer.  She is doing her fitness center exercises and cycling.  She is not doing as much of the weights because of her concerns from last injury.  No further chest tightness or pressure with rest or exertion.  No PND, orthopnea or edema. She denies any rapid irregular heartbeats palpitations.  No TIA or amaurosis fugax symptoms.  No syncope/near syncope.  No claudication  ROS: A comprehensive was performed.  Pertinent + above.  Review of Systems  Constitutional: Negative for malaise/fatigue (Just tires out a little easier than usual - not concerned).       She  indicates that she has been out of shape  HENT: Negative for nosebleeds.   Respiratory: Negative for cough, shortness of breath (Only with heavy exertion.) and wheezing.   Cardiovascular: Negative for palpitations.  Gastrointestinal: Negative for blood in stool, heartburn and melena.  Genitourinary: Negative for hematuria.  Musculoskeletal: Negative for joint pain (No longer noticing the right shoulder pain) and myalgias.  Neurological: Negative for dizziness, seizures and loss of consciousness.  Endo/Heme/Allergies: Negative for environmental allergies. Does not bruise/bleed easily.  Psychiatric/Behavioral: The patient is not nervous/anxious.   All other systems reviewed and are negative.   Past Medical History:  Diagnosis Date  . Anxiety   . Arthritis   . Asthma    seasonal; spring/fall  . Diabetes mellitus without complication ([place])   . Dyslipidemia   . Family history of early CAD    Dobutamine Cardiolite Study 04/03/2002--Ejection Fraction on post stress images is 76%  . GERD (gastroesophageal reflux disease)   . Glaucoma   . Hypertension   . Neutropenia, cyclic (HCC)    Benign, followed by hematology  . PONV (postoperative nausea and vomiting)   . Sleep apnea    CPAP at night  . Thrombocytopenia ([place])     Past Surgical History:  Procedure Laterality Date  . COLONOSCOPY  08/09/2008     YPP:JKDTOI rectum, few right-sided diverticula, diminutive  polypoid mucosa at the ileocecal valve status post cold biopsy removal  Remainder of colonic mucosa appeared normal  . COLONOSCOPY, ESOPHAGOGASTRODUODENOSCOPY (EGD) AND ESOPHAGEAL DILATION N/A 09/29/2013   Procedure: COLONOSCOPY, ESOPHAGOGASTRODUODENOSCOPY (EGD) AND ESOPHAGEAL DILATION (ED);  Surgeon: Daneil Dolin, MD;  Location: [facility];  Service: Endoscopy;  Laterality: N/A;  12:15  . KNEE ARTHROSCOPY  07/19/2011   Procedure: ARTHROSCOPY KNEE;  Surgeon: Sanjuana Kava;  Location: [facility];  Service: Orthopedics;  Laterality:  Left;  partial medial menisectomy anterior horn, removal of loose body  . KNEE SURGERY    . NM MYOVIEW LTD  09/2015   LOW RISK EF 66%, hyperdynamic. HTN response to exercise. NO EKG changes. No Ischemia or Infarct.    . TONSILLECTOMY  1972  . TRANSTHORACIC ECHOCARDIOGRAM  2003   EF normal, moderate asymmetric LVH; AV tri-leaflet, mild aortic sclerosis, mild mitral annular calcification, mild MR, mild TR  . [place], partial     Current Meds  Medication Sig  . albuterol (PROVENTIL HFA;VENTOLIN HFA) 108 (90 BASE) MCG/ACT inhaler Inhale 2 puffs into the lungs every 6 (six) hours as needed for wheezing. For shortness of breath  . ALOE VERA JUICE LIQD Take 1 Bottle by mouth daily.  Marland Kitchen aspirin 81 MG tablet Take 81 mg by mouth daily.  . brimonidine (ALPHAGAN) 0.15 % ophthalmic solution Place 1 drop into both eyes 2 (two) times daily.    . Calcium Carbonate-Vit D-Min (CALTRATE PLUS PO) Take 1 tablet by mouth daily.  . fluticasone (FLOVENT HFA) 110 MCG/ACT inhaler Inhale 1 puff into the lungs daily as needed (FOR SHORTNESS OF BREATH). For shortness of breath  . hydrochlorothiazide (HYDRODIURIL) 25 MG tablet Take 1 tablet (25 mg total) by mouth daily.  . lansoprazole (PREVACID) 30 MG capsule TAKE 1 CAPSULE BY MOUTH EVERY DAY  . Multiple Vitamins-Minerals (CENTRUM SILVER PO) Take 1 capsule by mouth daily.  . NON FORMULARY Take by mouth daily as needed. Aloe vera juice  . NON FORMULARY at bedtime. Uses cpap at night  . Omega-3 Fatty Acids (FISH OIL) 1000 MG CAPS Take 1 capsule by mouth daily.  . Potassium 99 MG TABS Take 1 tablet by mouth daily as needed (SUPPLEMENT). Takes infrequently with no particular schedule  . simvastatin (ZOCOR) 40 MG tablet TAKE 1 TABLET(40 MG) BY MOUTH AT BEDTIME  . Travoprost, BAK Free, (TRAVATAN) 0.004 % SOLN ophthalmic solution Place 1 drop into both eyes at bedtime.    . vitamin C (ASCORBIC ACID) 500 MG tablet Take 1 tablet by mouth daily.    . [DISCONTINUED] aspirin 81 MG chewable tablet Chew 1 tablet by mouth every evening.   . [DISCONTINUED] Cholecalciferol (VITAMIN D3) 1000 units CAPS Take by mouth.  . [DISCONTINUED] simvastatin (ZOCOR) 40 MG tablet TAKE 1 TABLET(40 MG) BY MOUTH AT BEDTIME    Allergies  Allergen Reactions  . Trilyte [Peg 3350-Kcl-Na Bicarb-Nacl] Swelling    Swelling of tongue   Social History   Tobacco Use  . Smoking status: Former Smoker    Packs/day: 0.25    Years: 8.50    Pack years: 2.12    Types: Cigarettes    Last attempt to quit: 07/15/1978    Years since quitting: 39.2  . Smokeless tobacco: Never Used  Substance Use Topics  . Alcohol use: Yes    Comment: occasional  . Drug use: No   Social History   Social History Narrative  . Not on file    Family History family history includes Clotting disorder in her brother; Diabetes in  her brother; Gastric cancer in her paternal grandmother; Heart disease in her father and paternal grandfather; Hyperlipidemia in her mother; Kidney failure in her paternal grandmother; Leukemia in her mother; Prostate cancer in her maternal grandfather.  Wt Readings from Last 3 Encounters:  10/22/17 177 lb (80.3 kg)  08/09/17 175 lb 6.4 oz (79.6 kg)  07/11/17 174 lb (78.9 kg)    PHYSICAL EXAM BP 112/68 (BP Location: Left Arm, Patient Position: Sitting, Cuff Size: Large)   Pulse 71   Ht 5\' 4"  (1.626 m)   Wt 177 lb (80.3 kg)   BMI 30.38 kg/m   Physical Exam  Constitutional: She is oriented to person, place, and time. She appears well-developed and well-nourished.  HENT:  Head: Normocephalic and atraumatic.  Eyes: EOM are normal.  Neck: No hepatojugular reflux and no JVD present. Carotid bruit is not present.  Cardiovascular: Normal rate, regular rhythm, normal heart sounds and intact distal pulses.  No extrasystoles are present. PMI is not displaced. Exam reveals no gallop and no friction rub.  No murmur heard. Pulmonary/Chest: Effort normal and breath  sounds normal. No respiratory distress. She has no wheezes. She has no rales.  Abdominal: Soft. Bowel sounds are normal. She exhibits no distension. There is no tenderness. There is no rebound.  Musculoskeletal: Normal range of motion.  Neurological: She is alert and oriented to person, place, and time.  Psychiatric: She has a normal mood and affect. Her behavior is normal. Judgment and thought content normal.  Nursing note and vitals reviewed.   Adult ECG Report n/a.   Other studies Reviewed: Additional studies/ records that were reviewed today include:  Recent Labs:   Cholesterol from December 2018: TC 193, TG 91, HDL 71, LDL 104.  BUN/creatinine 15/0.65.  TSH 1.5.  Normal LFTs.  - PCP just reduced from 20 mg to10 mg simvastatin based upon labs - she has been taking 20 mg.   ASSESSMENT / PLAN: Problem List Items Addressed This Visit    Intermittent palpitations (Chronic)    No further complaints.  Not on therapy.      Hyperlipidemia (Chronic)    As noted.  Goal LDL should be less than 100.  Will increase simvastatin to 40 mg.      Relevant Medications   aspirin 81 MG tablet   simvastatin (ZOCOR) 40 MG tablet   Hollenhorst plaque, both eyes    Relatively normal carotid Dopplers.  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  Would recommend targeting HDL level that is slightly lower than it currently is. Plan: Increase simvastatin back to 40 mg daily.  Target LDL between 70-100.  With goal < 70.      Relevant Medications   aspirin 81 MG tablet   simvastatin (ZOCOR) 40 MG tablet   Essential hypertension - Primary (Chronic)    Blood pressure doing well today on HCTZ alone.      Relevant Medications   aspirin 81 MG tablet   simvastatin (ZOCOR) 40 MG tablet   Chest pain with low risk for cardiac etiology    As expected, low risk.  No further symptoms.  Did well on ETT. Most likely musculoskeletal.         Current medicines are reviewed at  length with the patient today. (+/- concerns) n/a The following changes have been made: -->   Patient Instructions  MEDICATION INSTRUCTION  INCREASE TO SIMVASTATIN 40 MG  ( GO BACK TO REGULAR DOSE BEFORE YOU DECREASE)  NO OTHER  CHANGES    Your physician wants you to follow-up in [place]. You will receive a reminder letter in the mail two months in advance. If you don't receive a letter, please call our office to schedule the follow-up appointment.    If you need a refill on your cardiac medications before your next appointment, please call your pharmacy.    Studies Ordered:   No orders of the defined types were placed in this encounter.     Glenetta Hew, M.D., M.S. Interventional Cardiologist   Pager # [phone] Phone # [phone] [address]. [place], [place] [address]

## 2017-10-22 NOTE — Assessment & Plan Note (Signed)
As expected, low risk.  No further symptoms.  Did well on ETT. Most likely musculoskeletal.

## 2018-02-07 ENCOUNTER — Other Ambulatory Visit: Payer: Self-pay | Admitting: Family Medicine

## 2018-02-07 DIAGNOSIS — Z1231 Encounter for screening mammogram for malignant neoplasm of breast: Secondary | ICD-10-CM

## 2018-03-04 ENCOUNTER — Ambulatory Visit
Admission: RE | Admit: 2018-03-04 | Discharge: 2018-03-04 | Disposition: A | Discharge status: Home / Self Care | Payer: Medicare Other | Source: Ambulatory Visit | Attending: Family Medicine | Admitting: Family Medicine

## 2018-03-04 DIAGNOSIS — Z1231 Encounter for screening mammogram for malignant neoplasm of breast: Secondary | ICD-10-CM

## 2018-03-06 ENCOUNTER — Other Ambulatory Visit: Payer: Self-pay | Admitting: Cardiology

## 2018-03-06 NOTE — Telephone Encounter (Signed)
Rx sent to pharmacy   

## 2018-05-26 ENCOUNTER — Other Ambulatory Visit (HOSPITAL_COMMUNITY): Payer: Self-pay | Admitting: Family Medicine

## 2018-05-26 DIAGNOSIS — Z780 Asymptomatic menopausal state: Secondary | ICD-10-CM

## 2018-06-04 ENCOUNTER — Other Ambulatory Visit: Payer: Self-pay | Admitting: Cardiology

## 2018-10-15 ENCOUNTER — Telehealth: Payer: Self-pay | Admitting: *Deleted

## 2018-10-15 MED ORDER — HYDROCHLOROTHIAZIDE 25 MG PO TABS: 25.0000 mg | ORAL_TABLET | Freq: Every day | ORAL | 3 refills | Status: DC

## 2018-10-15 NOTE — Telephone Encounter (Signed)
   Primary Cardiologist:  Glenetta Hew, MD   Patient contacted.  History reviewed.  No symptoms to suggest any unstable cardiac conditions.  Based on discussion, with current pandemic situation, we will be postponing this appointment for Danielle [name] with a plan for f/u sept 17, 9:40 am with dr harding or sooner if feasible/necessary.  If symptoms change, she has been instructed to contact our office.   Patient requested refill for hctz 25 g one tablet daily - completed. Raiford Simmonds, RN  10/15/2018 4:28 PM         .

## 2018-10-24 ENCOUNTER — Ambulatory Visit: Payer: Medicare Other | Admitting: Cardiology

## 2018-12-26 ENCOUNTER — Other Ambulatory Visit: Payer: Medicare Other

## 2018-12-26 ENCOUNTER — Other Ambulatory Visit: Payer: Self-pay | Admitting: Internal Medicine

## 2018-12-26 DIAGNOSIS — Z20822 Contact with and (suspected) exposure to covid-19: Secondary | ICD-10-CM

## 2018-12-28 LAB — NOVEL CORONAVIRUS, NAA: SARS-CoV-2, NAA: NOT DETECTED

## 2018-12-31 ENCOUNTER — Telehealth: Payer: Self-pay | Admitting: Family Medicine

## 2018-12-31 NOTE — Telephone Encounter (Signed)
° °  Pt was given COVID results

## 2019-01-24 IMAGING — DX DG CHEST 2V
2 series · 2 of 2 positions shown · non-contrast
Comparison: 09/01/2015.

CLINICAL DATA: 76-year-old female with right shoulder pain
radiating down the arm and chest pain following exercise 2 days ago.

EXAM:
CHEST  2 VIEW

[chest pa]
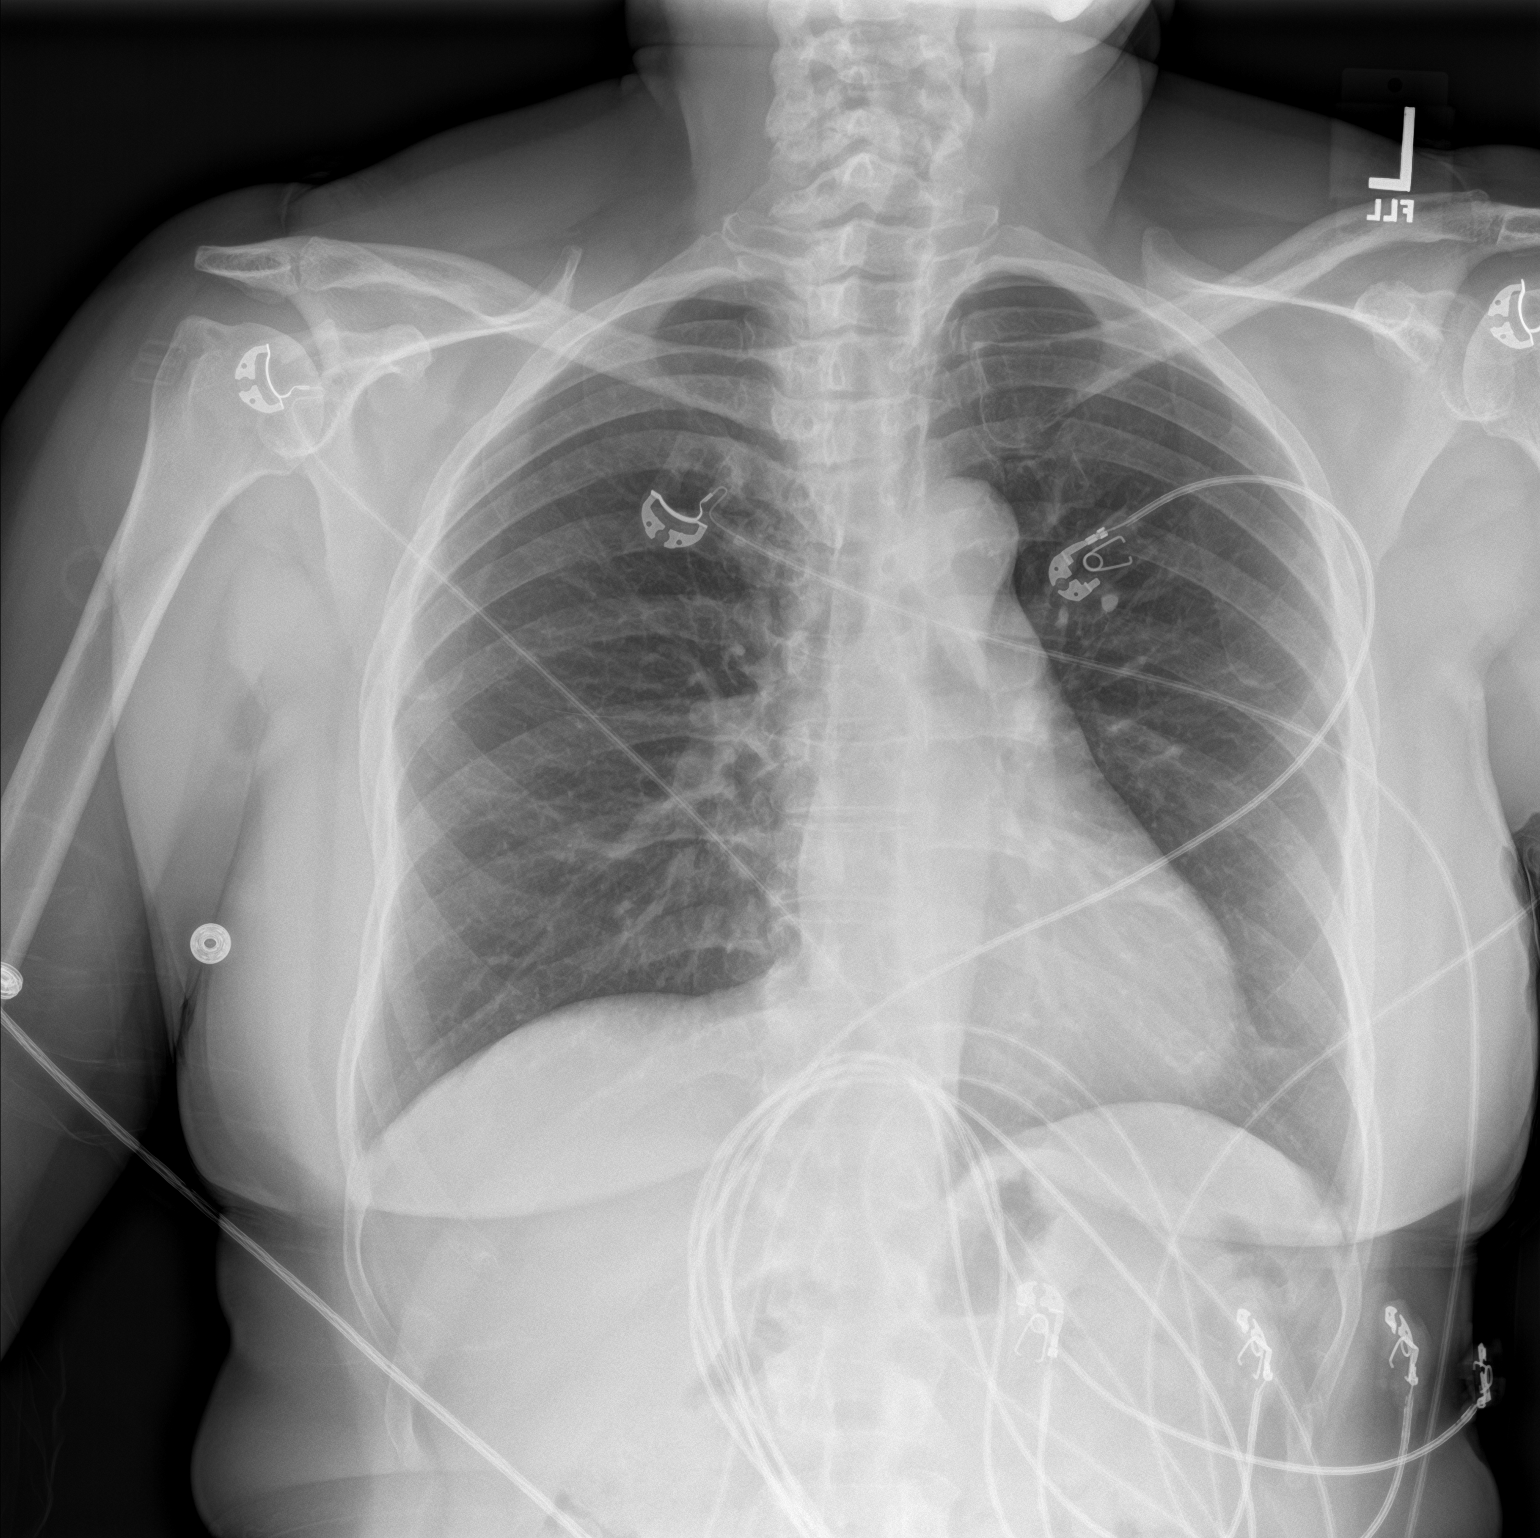

[chest lat]
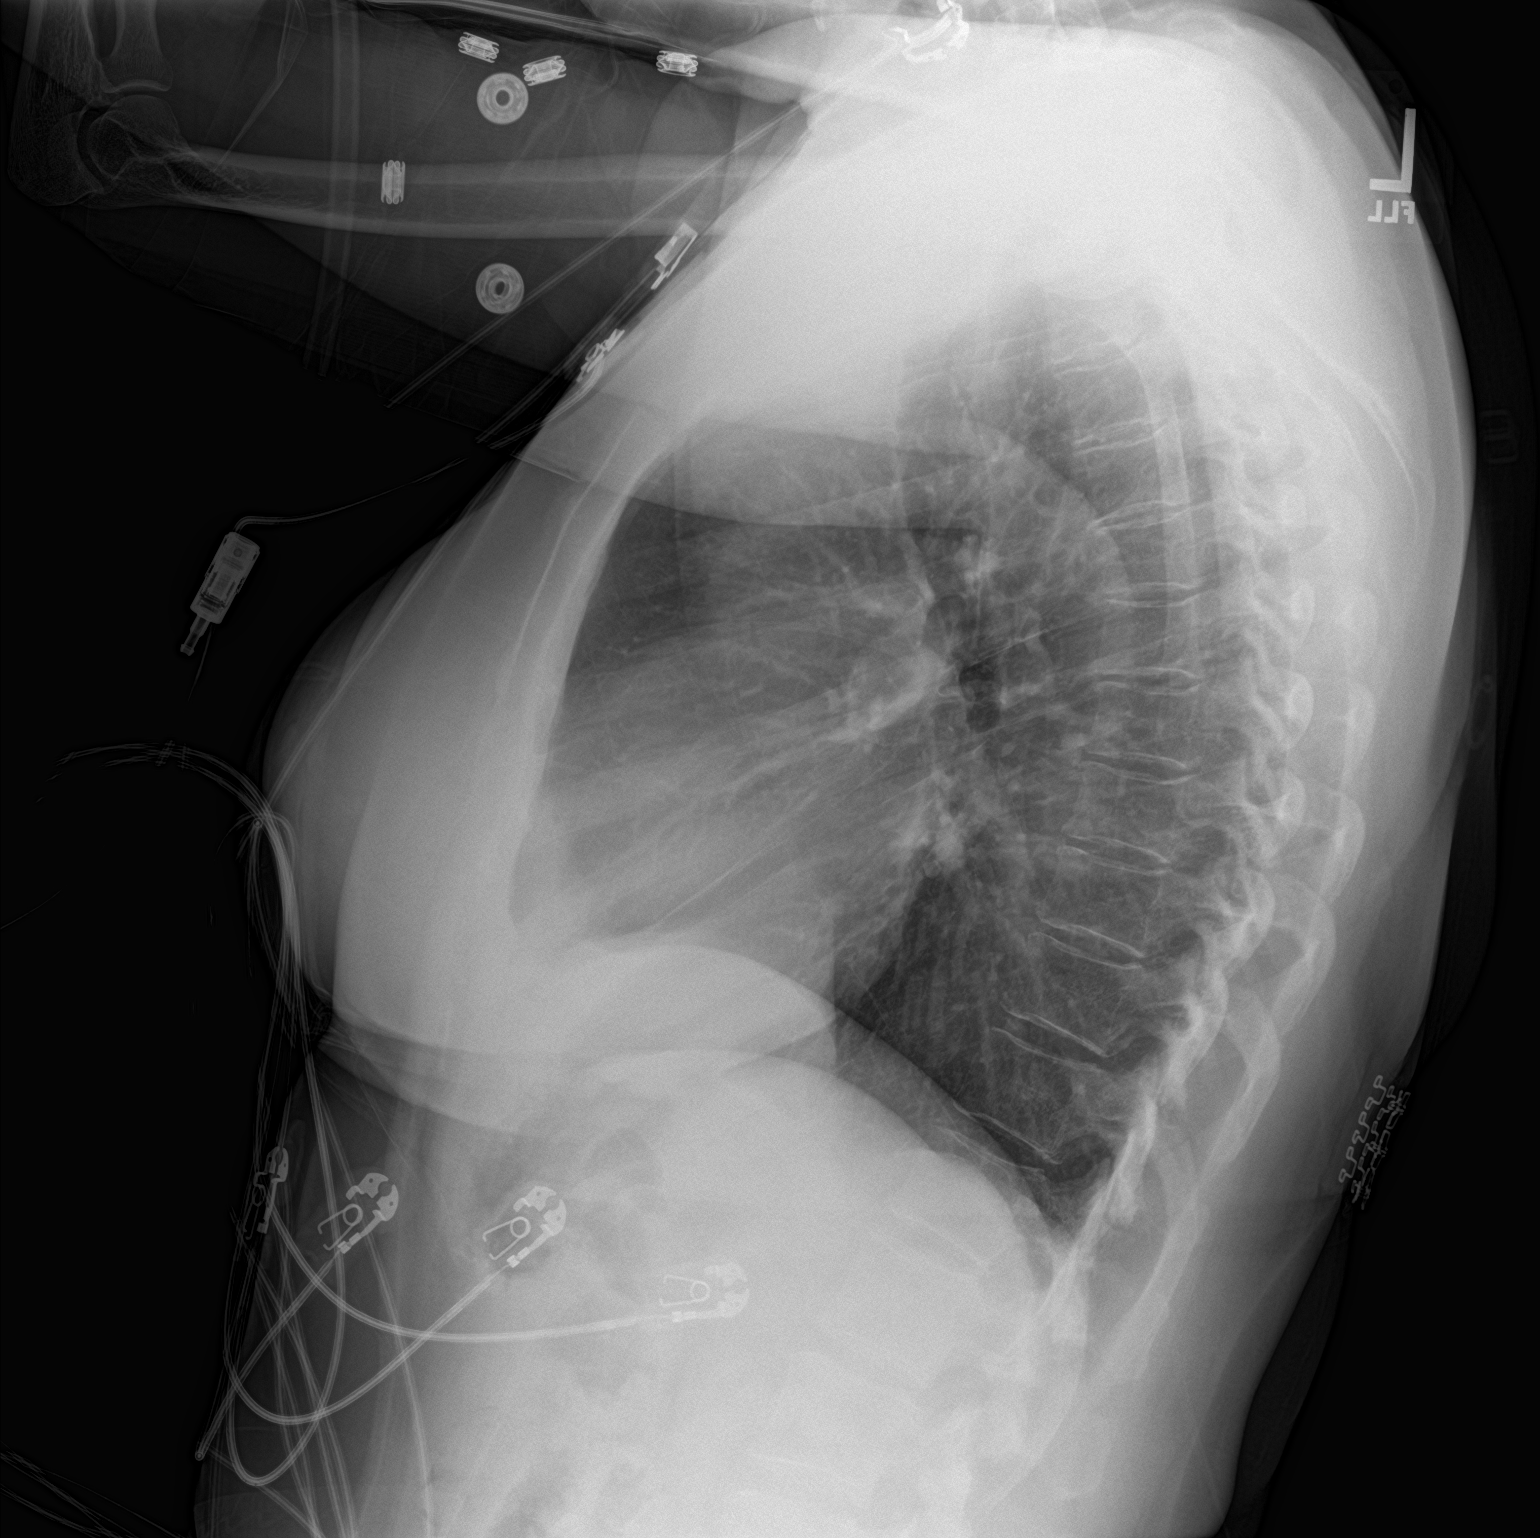

[2 of 2 positions shown; findings below may reference images not displayed]

FINDINGS: Lung volumes are stable and within normal limits. Mediastinal
contours remain normal. Visualized tracheal air column is within
normal limits. No pneumothorax, pulmonary edema, pleural effusion or
confluent pulmonary opacity.

Negative visible bowel gas pattern.

Bone mineralization is within normal limits for age. Right shoulder
structures appear stable and intact. No acute osseous abnormality
identified.
IMPRESSION: Stable since 4358 and normal for age.

## 2019-02-16 ENCOUNTER — Other Ambulatory Visit: Payer: Self-pay | Admitting: Family Medicine

## 2019-02-16 DIAGNOSIS — Z1231 Encounter for screening mammogram for malignant neoplasm of breast: Secondary | ICD-10-CM

## 2019-02-24 ENCOUNTER — Other Ambulatory Visit: Payer: Self-pay

## 2019-02-24 DIAGNOSIS — Z20822 Contact with and (suspected) exposure to covid-19: Secondary | ICD-10-CM

## 2019-02-26 LAB — NOVEL CORONAVIRUS, NAA: SARS-CoV-2, NAA: DETECTED — AB

## 2019-02-27 ENCOUNTER — Other Ambulatory Visit (HOSPITAL_COMMUNITY): Payer: Self-pay | Admitting: Family Medicine

## 2019-02-27 DIAGNOSIS — R102 Pelvic and perineal pain: Secondary | ICD-10-CM

## 2019-03-23 ENCOUNTER — Ambulatory Visit (HOSPITAL_COMMUNITY)
Admission: RE | Admit: 2019-03-23 | Discharge: 2019-03-23 | Disposition: A | Discharge status: Home / Self Care | Payer: Medicare Other | Source: Ambulatory Visit | Attending: Family Medicine | Admitting: Family Medicine

## 2019-03-23 ENCOUNTER — Other Ambulatory Visit: Payer: Self-pay

## 2019-03-23 DIAGNOSIS — R102 Pelvic and perineal pain: Secondary | ICD-10-CM | POA: Diagnosis present

## 2019-03-25 ENCOUNTER — Telehealth: Payer: Self-pay | Admitting: *Deleted

## 2019-03-25 NOTE — Telephone Encounter (Signed)
CALLED AND SPOKE TO PATIENT ABOUT APPT FOR 03/26/19  WOULD LIKE TO [place] TO VIRTUAL VISIT  PATIENT WOULD RATHER HAVE AN IN OFFICE VISIT -- APPOINTMENT MOVED TO 04/15/19 WITH HUSBAND PATIENT VERBALIZED UNDERSTANDING

## 2019-03-26 ENCOUNTER — Ambulatory Visit: Payer: Medicare Other | Admitting: Cardiology

## 2019-04-02 ENCOUNTER — Ambulatory Visit
Admission: RE | Admit: 2019-04-02 | Discharge: 2019-04-02 | Disposition: A | Discharge status: Home / Self Care | Payer: Medicare Other | Source: Ambulatory Visit | Attending: Family Medicine | Admitting: Family Medicine

## 2019-04-02 ENCOUNTER — Other Ambulatory Visit: Payer: Self-pay

## 2019-04-02 DIAGNOSIS — Z1231 Encounter for screening mammogram for malignant neoplasm of breast: Secondary | ICD-10-CM

## 2019-04-15 ENCOUNTER — Other Ambulatory Visit: Payer: Self-pay

## 2019-04-15 ENCOUNTER — Ambulatory Visit: Payer: Medicare Other | Admitting: Cardiology

## 2019-04-15 ENCOUNTER — Encounter: Payer: Self-pay | Admitting: Cardiology

## 2019-04-15 VITALS — BP 120/69 | HR 56 | Ht 64.0 in | Wt 172.0 lb

## 2019-04-15 DIAGNOSIS — I10 Essential (primary) hypertension: Secondary | ICD-10-CM | POA: Diagnosis not present

## 2019-04-15 DIAGNOSIS — R002 Palpitations: Secondary | ICD-10-CM

## 2019-04-15 DIAGNOSIS — E782 Mixed hyperlipidemia: Secondary | ICD-10-CM | POA: Diagnosis not present

## 2019-04-15 DIAGNOSIS — H34213 Partial retinal artery occlusion, bilateral: Secondary | ICD-10-CM | POA: Diagnosis not present

## 2019-04-15 NOTE — Patient Instructions (Addendum)
Medication Instructions:  NO CHANGES   If you need a refill on your cardiac medications before your next appointment, please call your pharmacy.   Lab work:  NOT NEEDED  Testing/Procedures: NOT NEEDED  Follow-Up: At [facility], you and your health needs are our priority.  As part of our continuing mission to provide you with exceptional heart care, we have created designated Provider Care Teams.  These Care Teams include your primary Cardiologist (physician) and Advanced Practice Providers (APPs -  Physician Assistants and Nurse Practitioners) who all work together to provide you with the care you need, when you need it. . You will need a follow up appointment in  12 months- OCT 2021.  Please call our office 2 months in advance to schedule this appointment.  You may see Glenetta Hew, MD or one of the following Advanced Practice Providers on your designated Care Team:   . Rosaria Ferries, PA-C . Jory Sims, DNP, ANP  Any Other Special Instructions Will Be Listed Below.

## 2019-04-15 NOTE — Progress Notes (Signed)
PCP: Sharilyn Sites, MD  Clinic Note: Chief Complaint  Patient presents with  . Follow-up    No major complaints.  . Hypertension    Controlled    HPI: Danielle [name] is a 78 y.o. female with family history of CAD who has hypertension and hyperlipidemia.  She presents here for delayed annual follow-up   Myoview stress test spring 2017 for follow-up evaluation of chest pain: Nonischemic.Danielle [name] was last seen in April 2019 following evaluation with an exercise stress test for atypical chest pain on which she did very well exercising to 10 metabolic equivalents with no ECG changes.  Studies Reviewed:   None  Recent Hospitalizations: None, however she was quarantined for 14 days in June which tested positive for COVID-19, and her husband actually spent 5 days in the hospital.  Interval History: Danielle [name] returns today stating that she is still sore somewhat recovering from the shock of having developed COVID-19.  Really all she noted was that she lost taste and smell sense and felt just kind of "cruddy ".  She never really had too much of a fever or dyspnea.  A little bit of runny nose and cough.  She still has vague strange sensations in her chest off and on where they feel a little pinprick type sensation.  Also some little fluttering off and on.  The symptoms can happen with or without exertion and are relatively significant which she had before.  She has occasional headaches and dizziness, but nothing significant no TIA or amaurosis fugax.  No syncope or near-syncope.  Despite having some intermittent skipped beats and fluttering, no rapid irregular heartbeats.  No claudication.  ROS: A comprehensive was performed.  Pertinent + above.  Review of Systems  Constitutional: Negative for malaise/fatigue (More out of shape than usual after her COVID) and weight loss.       She indicates that she has been out of shape  HENT: Negative for nosebleeds.   Respiratory:  Negative for cough, shortness of breath (Only with heavy exertion.) and wheezing.   Cardiovascular: Positive for palpitations.  Musculoskeletal: Joint pain: No longer noticing the right shoulder pain.  Neurological: Positive for headaches. Negative for dizziness, seizures and loss of consciousness.  Endo/Heme/Allergies: Negative for environmental allergies.  Psychiatric/Behavioral: The patient is not nervous/anxious and does not have insomnia.   All other systems reviewed and are negative.  The patient does not have symptoms concerning for C OVID-19 infection (fever, chills, cough, or new shortness of breath).  She has actually been diagnosed and treated with for COVID-19.  Stayed at home. The patient is practicing social distancing.   COVID-19 Education: The signs and symptoms of COVID-19 were discussed with the patient and how to seek care for testing (follow up with PCP or arrange E-visit).   The importance of social distancing was discussed today.   Past Medical History:  Diagnosis Date  . Anxiety   . Arthritis   . Asthma    seasonal; spring/fall  . Diabetes mellitus without complication ([place])   . Dyslipidemia   . Family history of early CAD    Dobutamine Cardiolite Study 04/03/2002--Ejection Fraction on post stress images is 76%  . GERD (gastroesophageal reflux disease)   . Glaucoma   . Hypertension   . Neutropenia, cyclic (HCC)    Benign, followed by hematology  . PONV (postoperative nausea and vomiting)   . Sleep apnea    CPAP at night  . Thrombocytopenia ([place])  Past Surgical History:  Procedure Laterality Date  . COLONOSCOPY  08/09/2008     [id number] rectum, few right-sided diverticula, diminutive  polypoid mucosa at the ileocecal valve status post cold biopsy removal Remainder of colonic mucosa appeared normal  . COLONOSCOPY, ESOPHAGOGASTRODUODENOSCOPY (EGD) AND ESOPHAGEAL DILATION N/A 09/29/2013   Procedure: COLONOSCOPY, ESOPHAGOGASTRODUODENOSCOPY (EGD) AND  ESOPHAGEAL DILATION (ED);  Surgeon: Daneil Dolin, MD;  [facility];  Service: Endoscopy;  Laterality: N/A;  12:15  . KNEE ARTHROSCOPY  07/19/2011   Procedure: ARTHROSCOPY KNEE;  Surgeon: Sanjuana Kava;  Location: [facility];  Service: Orthopedics;  Laterality: Left;  partial medial menisectomy anterior horn, removal of loose body  . KNEE SURGERY    . NM MYOVIEW LTD  09/2015   LOW RISK EF 66%, hyperdynamic. HTN response to exercise. NO EKG changes. No Ischemia or Infarct.    . TONSILLECTOMY  1972  . TRANSTHORACIC ECHOCARDIOGRAM  2003   EF normal, moderate asymmetric LVH; AV tri-leaflet, mild aortic sclerosis, mild mitral annular calcification, mild MR, mild TR  . [place], partial     Current Meds  Medication Sig  . albuterol (PROVENTIL HFA;VENTOLIN HFA) 108 (90 BASE) MCG/ACT inhaler Inhale 2 puffs into the lungs every 6 (six) hours as needed for wheezing. For shortness of breath  . ALOE VERA JUICE LIQD Take 1 Bottle by mouth daily.  Marland Kitchen aspirin 81 MG tablet Take 81 mg by mouth daily.  . brimonidine (ALPHAGAN) 0.15 % ophthalmic solution Place 1 drop into both eyes 2 (two) times daily.    . Calcium Carbonate-Vit D-Min (CALTRATE PLUS PO) Take 1 tablet by mouth daily.  . celecoxib (CELEBREX) 200 MG capsule Take 200 mg by mouth 2 (two) times daily. Take one tablet twice daily for a week then take one tablet daily.  . fluticasone (FLOVENT HFA) 110 MCG/ACT inhaler Inhale 1 puff into the lungs daily as needed (FOR SHORTNESS OF BREATH). For shortness of breath  . hydrochlorothiazide (HYDRODIURIL) 25 MG tablet Take 1 tablet (25 mg total) by mouth daily.  . lansoprazole (PREVACID) 30 MG capsule TAKE 1 CAPSULE BY MOUTH EVERY DAY  . Multiple Vitamins-Minerals (CENTRUM SILVER PO) Take 1 capsule by mouth daily.  . NON FORMULARY Take by mouth daily as needed. Aloe vera juice  . NON FORMULARY at bedtime. Uses cpap at night  . Omega-3 Fatty Acids (FISH OIL) 1000 MG CAPS  Take 1 capsule by mouth daily.  . Potassium 99 MG TABS Take 1 tablet by mouth daily as needed (SUPPLEMENT). Takes infrequently with no particular schedule  . simvastatin (ZOCOR) 40 MG tablet TAKE 1 TABLET(40 MG) BY MOUTH AT BEDTIME  . Travoprost, BAK Free, (TRAVATAN) 0.004 % SOLN ophthalmic solution Place 1 drop into both eyes at bedtime.    . vitamin C (ASCORBIC ACID) 500 MG tablet Take 1 tablet by mouth daily.    Allergies  Allergen Reactions  . Trilyte [Peg 3350-Kcl-Na Bicarb-Nacl] Swelling    Swelling of tongue   Social History   Tobacco Use  . Smoking status: Former Smoker    Packs/day: 0.25    Years: 8.50    Pack years: 2.12    Types: Cigarettes    Quit date: 07/15/1978    Years since quitting: 40.7  . Smokeless tobacco: Never Used  Substance Use Topics  . Alcohol use: Yes    Comment: occasional  . Drug use: No   Social History   Social History Narrative  . Not on  file    Family History family history includes Clotting disorder in her brother; Diabetes in her brother; Gastric cancer in her paternal grandmother; Heart disease in her father and paternal grandfather; Hyperlipidemia in her mother; Kidney failure in her paternal grandmother; Leukemia in her mother; Prostate cancer in her maternal grandfather.  Wt Readings from Last 3 Encounters:  04/15/19 172 lb (78 kg)  10/22/17 177 lb (80.3 kg)  08/09/17 175 lb 6.4 oz (79.6 kg)    PHYSICAL EXAM BP 120/69   Pulse (!) 56   Ht 5\' 4"  (1.626 m)   Wt 172 lb (78 kg)   SpO2 100%   BMI 29.52 kg/m   Physical Exam  Constitutional: She is oriented to person, place, and time. She appears well-developed and well-nourished.  Healthy-appearing.  Well-groomed  HENT:  Head: Normocephalic and atraumatic.  Neck: Normal range of motion. Neck supple. No hepatojugular reflux and no JVD present. Carotid bruit is not present.  Cardiovascular: Normal rate, regular rhythm, normal heart sounds and intact distal pulses.  No extrasystoles  are present. PMI is not displaced. Exam reveals no gallop and no friction rub.  No murmur heard. Pulmonary/Chest: Effort normal and breath sounds normal. No respiratory distress. She has no wheezes. She has no rales.  Abdominal: Soft. Bowel sounds are normal. She exhibits no distension. There is no abdominal tenderness.  Musculoskeletal: Normal range of motion.  Neurological: She is alert and oriented to person, place, and time.  Psychiatric: She has a normal mood and affect. Her behavior is normal. Judgment and thought content normal.  Nursing note and vitals reviewed.   Adult ECG Report Sinus bradycardia, rate 58 bpm.  Relatively normal axis.  It appears that V2 and V3 have reversed (computer reads as possible anterior MI, age-indeterminate, however if the leads are appropriately placed, this is not the case) and this situation no change from previous   Other studies Reviewed: Additional studies/ records that were reviewed today include:  Recent Labs: From [facility] December 30, 2018: TC 190, TG 70, HDL 78, LDL 98.  A1c 5.9.  Hgb 12.6. Cr 0.72.  K+ 4.0.  TSH 4.03.  Apparently she is now taking 3 x 10 mg tabs of simvastatin   ASSESSMENT / PLAN: Problem List Items Addressed This Visit    Intermittent palpitations (Chronic)    Relatively benign not very frequent.  At present, we will hold off on treating such as she has resting bradycardia with a rate of 56 bpm.      Relevant Orders   EKG 12-Lead (Completed)   Essential hypertension - Primary (Chronic)    Blood pressure looks great.  Remains on HCTZ alone.      Relevant Orders   EKG 12-Lead (Completed)   Hyperlipidemia (Chronic)    LDL currently under 100.  Continue what ever dose of simvastatin she is on.  She seems to think she is taking what amounts to 30 mg.  But we have recorded 40 mg. Labs are being followed by PCP but also monitored here.      Hollenhorst plaque, both eyes    Had normal carotid Dopplers and normal echo.  Plan now  will be to continue the strength to modification including cholesterol lowering.         Current medicines are reviewed at length with the patient today. (+/- concerns) n/a    Patient Instructions  Medication Instructions:  NO CHANGES   If you need a refill on your cardiac medications before your  next appointment, please call your pharmacy.   Lab work:  NOT NEEDED  Testing/Procedures: NOT NEEDED  Follow-Up: At [facility], you and your health needs are our priority.  As part of our continuing mission to provide you with exceptional heart care, we have created designated Provider Care Teams.  These Care Teams include your primary Cardiologist (physician) and Advanced Practice Providers (APPs -  Physician Assistants and Nurse Practitioners) who all work together to provide you with the care you need, when you need it. . You will need a follow up appointment in  12 months- OCT 2021.  Please call our office 2 months in advance to schedule this appointment.  You may see Glenetta Hew, MD or one of the following Advanced Practice Providers on your designated Care Team:   . Rosaria Ferries, PA-C . Jory Sims, DNP, ANP  Any Other Special Instructions Will Be Listed Below.   Studies Ordered:   Orders Placed This Encounter  Procedures  . EKG 12-Lead      Glenetta Hew, M.D., M.S. Interventional Cardiologist   Pager # [phone] Phone # [phone] [address]. [place], [place] [address]

## 2019-04-17 ENCOUNTER — Encounter: Payer: Self-pay | Admitting: Cardiology

## 2019-04-17 NOTE — Assessment & Plan Note (Signed)
Relatively benign not very frequent.  At present, we will hold off on treating such as she has resting bradycardia with a rate of 56 bpm.

## 2019-04-17 NOTE — Assessment & Plan Note (Signed)
Had normal carotid Dopplers and normal echo.  Plan now will be to continue the strength to modification including cholesterol lowering.

## 2019-04-17 NOTE — Assessment & Plan Note (Signed)
Blood pressure looks great.  Remains on HCTZ alone.

## 2019-04-17 NOTE — Assessment & Plan Note (Signed)
LDL currently under 100.  Continue what ever dose of simvastatin she is on.  She seems to think she is taking what amounts to 30 mg.  But we have recorded 40 mg. Labs are being followed by PCP but also monitored here.

## 2019-05-12 ENCOUNTER — Other Ambulatory Visit: Payer: Self-pay

## 2019-05-12 DIAGNOSIS — Z20822 Contact with and (suspected) exposure to covid-19: Secondary | ICD-10-CM

## 2019-05-13 LAB — NOVEL CORONAVIRUS, NAA: SARS-CoV-2, NAA: NOT DETECTED

## 2019-05-26 ENCOUNTER — Encounter: Payer: Self-pay | Admitting: Internal Medicine

## 2019-05-26 ENCOUNTER — Other Ambulatory Visit: Payer: Self-pay

## 2019-05-26 ENCOUNTER — Ambulatory Visit: Payer: Medicare Other | Admitting: Internal Medicine

## 2019-05-26 VITALS — BP 138/78 | HR 78 | Temp 97.0°F | Ht 63.0 in | Wt 173.0 lb

## 2019-05-26 DIAGNOSIS — R194 Change in bowel habit: Secondary | ICD-10-CM | POA: Diagnosis not present

## 2019-05-26 NOTE — Patient Instructions (Signed)
   Begin Benefiber 1 tablespoon daily for 3 weeks; then increase to 2 tablespoons daily thereafter  If you experience any bloating with this regimen, it should subside over time  We will discuss the possibility of 1 more colonoscopy when you return for follow-up in 2 months  Call me before your next office visit if the extra fiber is not improving your symptoms.

## 2019-05-26 NOTE — Progress Notes (Signed)
Primary Care Physician:  Sharilyn Sites, MD Primary Gastroenterologist:  Dr. Gala Romney  Pre-Procedure History & Physical: HPI:  Danielle [name] is a 78 y.o. female here for evaluation of altered bowel habits.  Patient she sometimes has Bristol type I stools along with regular formed 2 or 3.  Never has diarrhea; never see blood.  Never goes a day without a bowel movement.  Tried fiber 1 cereal for a while -  it bloated her and she stopped it.  No abdominal pain; no upper GI tract symptoms -  on lansoprazole with excellent control of symptoms.  No dysphagia.  Last colonoscopy age 62  -  6 years ago.  Colonic adenoma  -  ascending segment.  Tentative plans were for 1 more colonoscopy 7 years out.  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  Past Medical History:  Diagnosis Date  . Anxiety   . Arthritis   . Asthma    seasonal; spring/fall  . Diabetes mellitus without complication ([place])   . Dyslipidemia   . Family history of early CAD    Dobutamine Cardiolite Study 04/03/2002--Ejection Fraction on post stress images is 76%  . GERD (gastroesophageal reflux disease)   . Glaucoma   . Hypertension   . Neutropenia, cyclic (HCC)    Benign, followed by hematology  . PONV (postoperative nausea and vomiting)   . Sleep apnea    CPAP at night  . Thrombocytopenia ([place])     Past Surgical History:  Procedure Laterality Date  . COLONOSCOPY  08/09/2008     [id number] rectum, few right-sided diverticula, diminutive  polypoid mucosa at the ileocecal valve status post cold biopsy removal Remainder of colonic mucosa appeared normal  . COLONOSCOPY, ESOPHAGOGASTRODUODENOSCOPY (EGD) AND ESOPHAGEAL DILATION N/A 09/29/2013   Procedure: COLONOSCOPY, ESOPHAGOGASTRODUODENOSCOPY (EGD) AND ESOPHAGEAL DILATION (ED);  Surgeon: Daneil Dolin, MD;  [facility];  Service: Endoscopy;  Laterality: N/A;  12:15  . KNEE ARTHROSCOPY  07/19/2011   Procedure: ARTHROSCOPY KNEE;  Surgeon: Sanjuana Kava;   Location: [facility];  Service: Orthopedics;  Laterality: Left;  partial medial menisectomy anterior horn, removal of loose body  . KNEE SURGERY    . NM MYOVIEW LTD  09/2015   LOW RISK EF 66%, hyperdynamic. HTN response to exercise. NO EKG changes. No Ischemia or Infarct.    . TONSILLECTOMY  1972  . TRANSTHORACIC ECHOCARDIOGRAM  2003   EF normal, moderate asymmetric LVH; AV tri-leaflet, mild aortic sclerosis, mild mitral annular calcification, mild MR, mild TR  . [place], partial    Prior to Admission medications   Medication Sig Start Date End Date Taking? Authorizing Provider  albuterol (PROVENTIL HFA;VENTOLIN HFA) 108 (90 BASE) MCG/ACT inhaler Inhale 2 puffs into the lungs every 6 (six) hours as needed for wheezing. For shortness of breath   Yes [provider]  ALOE VERA JUICE LIQD Take 1 Bottle by mouth.    Yes [provider]  aspirin 81 MG tablet Take 81 mg by mouth daily.   Yes [provider]  brimonidine (ALPHAGAN) 0.15 % ophthalmic solution Place 1 drop into both eyes 2 (two) times daily.     Yes [provider]  fluticasone (FLOVENT HFA) 110 MCG/ACT inhaler Inhale 1 puff into the lungs daily as needed (FOR SHORTNESS OF BREATH). For shortness of breath   Yes [provider]  hydrochlorothiazide (HYDRODIURIL) 25 MG tablet Take 1 tablet (25 mg total) by mouth daily. 10/15/18  Yes Leonie Man, MD  lansoprazole (PREVACID) 30 MG capsule TAKE 1 CAPSULE BY MOUTH EVERY DAY 03/29/16  Yes Carlis Stable, NP  Multiple Vitamins-Minerals (CENTRUM SILVER PO) Take 1 capsule by mouth daily.   Yes [provider]  NON FORMULARY Take by mouth daily as needed. Aloe vera juice   Yes [provider]  NON FORMULARY at bedtime. Uses cpap at night   Yes [provider]  Omega-3 Fatty Acids (FISH OIL) 1000 MG CAPS Take 1 capsule by mouth daily.   Yes [provider]  Potassium 99 MG TABS Take 1 tablet  by mouth daily as needed (SUPPLEMENT). Takes infrequently with no particular schedule   Yes [provider]  simvastatin (ZOCOR) 10 MG tablet Take 10 mg by mouth daily. 05/24/19  Yes [provider]  Travoprost, BAK Free, (TRAVATAN) 0.004 % SOLN ophthalmic solution Place 1 drop into both eyes at bedtime.     Yes [provider]  vitamin C (ASCORBIC ACID) 500 MG tablet Take 1 tablet by mouth daily.   Yes [provider]  VITAMIN D, ERGOCALCIFEROL, PO Take by mouth daily.   Yes [provider]    Allergies as of 05/26/2019 - Review Complete 05/26/2019  Allergen Reaction Noted  . Trilyte [peg 3350-kcl-na bicarb-nacl] Swelling 09/29/2013    Family History  Problem Relation Age of Onset  . Leukemia Mother   . Hyperlipidemia Mother   . Heart disease Father   . Prostate cancer Maternal Grandfather   . Kidney failure Paternal Grandmother   . Gastric cancer Paternal Grandmother   . Heart disease Paternal Grandfather   . Diabetes Brother   . Clotting disorder Brother        blood clots  . Hypotension Neg Hx   . Malignant hyperthermia Neg Hx   . Pseudochol deficiency Neg Hx   . Colon cancer Neg Hx   . Breast cancer Neg Hx     Social History   Socioeconomic History  . Marital status: Married    Spouse name: Not on file  . Number of children: 1  . Years of education: 64  . Highest education level: Not on file  Occupational History  . Occupation: Retired Science writer: RETIRED  Social Needs  . Financial resource strain: Not on file  . Food insecurity    Worry: Not on file    Inability: Not on file  . Transportation needs    Medical: Not on file    Non-medical: Not on file  Tobacco Use  . Smoking status: Former Smoker    Packs/day: 0.25    Years: 8.50    Pack years: 2.12    Types: Cigarettes    Quit date: 07/15/1978    Years since quitting: 40.8  . Smokeless tobacco: Never Used  Substance and Sexual Activity  . Alcohol  use: Not Currently    Comment: occasional  . Drug use: No  . Sexual activity: Not on file  Lifestyle  . Physical activity    Days per week: Not on file    Minutes per session: Not on file  . Stress: Not on file  Relationships  . Social Herbalist on phone: Not on file    Gets together: Not on file    Attends religious service: Not on file    Active member of club or organization: Not on file    Attends meetings of clubs or organizations: Not on file  Relationship status: Not on file  . Intimate partner violence    Fear of current or ex partner: Not on file    Emotionally abused: Not on file    Physically abused: Not on file    Forced sexual activity: Not on file  Other Topics Concern  . Not on file  Social History Narrative  . Not on file    Review of Systems: See HPI, otherwise negative ROS  Physical Exam: BP 138/78   Pulse 78   Temp (!) 97 F (36.1 C) (Temporal)   Ht 5\' 3"  (1.6 m)   Wt 173 lb (78.5 kg)   BMI 30.65 kg/m  General:   Alert,  Well-developed, well-nourished, pleasant and cooperative in NAD  Lungs:  Clear throughout to auscultation.   No wheezes, crackles, or rhonchi. No acute distress. Heart:  Regular rate and rhythm; no murmurs, clicks, rubs,  or gallops. Abdomen: Non-distended, normal bowel sounds.  Soft and nontender without appreciable mass or hepatosplenomegaly.  Pulses:  Normal pulses noted. Extremities:  Without clubbing or edema. Rectal: Good sphincter tone no masses in the rectal vault.  Scant brown stools Hemoccult negative   Impression: 78 year old lady with nonspecifically altered bowel habits as described above.  Do not detect any alarm symptoms.  I feel the best approach here would be simply to bolster her fiber intake with something that she can tolerate and then reassess.  1 loose send is to whether or not she really needs 1 more colonoscopy.  GERD well-controlled on lansoprazole.  Recommendations:  Begin Benefiber 1  tablespoon daily for 3 weeks; then increase to 2 tablespoons daily thereafter  If  bloating with this regimen, it should subside over time  We will discuss the possibility of 1 more colonoscopy when she  returns for follow-up in 2 months  Call before next office visit if the extra fiber is not improving symptoms.   Notice: This dictation was prepared with Dragon dictation along with smaller phrase technology. Any transcriptional errors that result from this process are unintentional and may not be corrected upon review.

## 2019-07-14 ENCOUNTER — Ambulatory Visit: Payer: Medicare Other | Admitting: Internal Medicine

## 2019-07-16 DIAGNOSIS — H401232 Low-tension glaucoma, bilateral, moderate stage: Secondary | ICD-10-CM | POA: Diagnosis not present

## 2019-07-16 DIAGNOSIS — H2513 Age-related nuclear cataract, bilateral: Secondary | ICD-10-CM | POA: Diagnosis not present

## 2019-09-14 DIAGNOSIS — G4733 Obstructive sleep apnea (adult) (pediatric): Secondary | ICD-10-CM | POA: Diagnosis not present

## 2019-09-24 DIAGNOSIS — G4761 Periodic limb movement disorder: Secondary | ICD-10-CM | POA: Diagnosis not present

## 2019-09-24 DIAGNOSIS — E782 Mixed hyperlipidemia: Secondary | ICD-10-CM | POA: Diagnosis not present

## 2019-09-24 DIAGNOSIS — M169 Osteoarthritis of hip, unspecified: Secondary | ICD-10-CM | POA: Diagnosis not present

## 2019-09-24 DIAGNOSIS — E7849 Other hyperlipidemia: Secondary | ICD-10-CM | POA: Diagnosis not present

## 2019-09-24 DIAGNOSIS — Z1389 Encounter for screening for other disorder: Secondary | ICD-10-CM | POA: Diagnosis not present

## 2019-09-24 DIAGNOSIS — Z6829 Body mass index (BMI) 29.0-29.9, adult: Secondary | ICD-10-CM | POA: Diagnosis not present

## 2019-09-24 DIAGNOSIS — R7309 Other abnormal glucose: Secondary | ICD-10-CM | POA: Diagnosis not present

## 2019-09-24 DIAGNOSIS — Z0000 Encounter for general adult medical examination without abnormal findings: Secondary | ICD-10-CM | POA: Diagnosis not present

## 2019-09-24 DIAGNOSIS — I10 Essential (primary) hypertension: Secondary | ICD-10-CM | POA: Diagnosis not present

## 2019-09-24 DIAGNOSIS — R946 Abnormal results of thyroid function studies: Secondary | ICD-10-CM | POA: Diagnosis not present

## 2019-09-24 DIAGNOSIS — E559 Vitamin D deficiency, unspecified: Secondary | ICD-10-CM | POA: Diagnosis not present

## 2019-10-15 DIAGNOSIS — Z87440 Personal history of urinary (tract) infections: Secondary | ICD-10-CM | POA: Diagnosis not present

## 2019-10-15 DIAGNOSIS — N952 Postmenopausal atrophic vaginitis: Secondary | ICD-10-CM | POA: Diagnosis not present

## 2019-10-15 DIAGNOSIS — R350 Frequency of micturition: Secondary | ICD-10-CM | POA: Diagnosis not present

## 2019-10-19 DIAGNOSIS — H401232 Low-tension glaucoma, bilateral, moderate stage: Secondary | ICD-10-CM | POA: Diagnosis not present

## 2019-10-19 DIAGNOSIS — H2513 Age-related nuclear cataract, bilateral: Secondary | ICD-10-CM | POA: Diagnosis not present

## 2019-10-24 ENCOUNTER — Other Ambulatory Visit: Payer: Self-pay | Admitting: Cardiology

## 2019-12-01 DIAGNOSIS — H25813 Combined forms of age-related cataract, bilateral: Secondary | ICD-10-CM | POA: Diagnosis not present

## 2019-12-01 DIAGNOSIS — Z01812 Encounter for preprocedural laboratory examination: Secondary | ICD-10-CM | POA: Diagnosis not present

## 2019-12-01 DIAGNOSIS — Z20822 Contact with and (suspected) exposure to covid-19: Secondary | ICD-10-CM | POA: Diagnosis not present

## 2019-12-01 DIAGNOSIS — H401232 Low-tension glaucoma, bilateral, moderate stage: Secondary | ICD-10-CM | POA: Diagnosis not present

## 2019-12-21 DIAGNOSIS — H2512 Age-related nuclear cataract, left eye: Secondary | ICD-10-CM | POA: Diagnosis not present

## 2019-12-21 DIAGNOSIS — H401222 Low-tension glaucoma, left eye, moderate stage: Secondary | ICD-10-CM | POA: Diagnosis not present

## 2019-12-22 DIAGNOSIS — Z961 Presence of intraocular lens: Secondary | ICD-10-CM | POA: Diagnosis not present

## 2019-12-30 DIAGNOSIS — G4733 Obstructive sleep apnea (adult) (pediatric): Secondary | ICD-10-CM | POA: Diagnosis not present

## 2020-03-07 ENCOUNTER — Other Ambulatory Visit: Payer: Self-pay | Admitting: Family Medicine

## 2020-03-07 DIAGNOSIS — Z1231 Encounter for screening mammogram for malignant neoplasm of breast: Secondary | ICD-10-CM

## 2020-03-21 DIAGNOSIS — H2511 Age-related nuclear cataract, right eye: Secondary | ICD-10-CM | POA: Diagnosis not present

## 2020-03-21 DIAGNOSIS — H401112 Primary open-angle glaucoma, right eye, moderate stage: Secondary | ICD-10-CM | POA: Diagnosis not present

## 2020-03-31 DIAGNOSIS — G4733 Obstructive sleep apnea (adult) (pediatric): Secondary | ICD-10-CM | POA: Diagnosis not present

## 2020-04-05 ENCOUNTER — Ambulatory Visit: Payer: Medicare PPO

## 2020-04-06 DIAGNOSIS — Z833 Family history of diabetes mellitus: Secondary | ICD-10-CM | POA: Diagnosis not present

## 2020-04-06 DIAGNOSIS — Z809 Family history of malignant neoplasm, unspecified: Secondary | ICD-10-CM | POA: Diagnosis not present

## 2020-04-06 DIAGNOSIS — J45909 Unspecified asthma, uncomplicated: Secondary | ICD-10-CM | POA: Diagnosis not present

## 2020-04-06 DIAGNOSIS — I10 Essential (primary) hypertension: Secondary | ICD-10-CM | POA: Diagnosis not present

## 2020-04-06 DIAGNOSIS — Z8249 Family history of ischemic heart disease and other diseases of the circulatory system: Secondary | ICD-10-CM | POA: Diagnosis not present

## 2020-04-06 DIAGNOSIS — K219 Gastro-esophageal reflux disease without esophagitis: Secondary | ICD-10-CM | POA: Diagnosis not present

## 2020-04-06 DIAGNOSIS — Z7982 Long term (current) use of aspirin: Secondary | ICD-10-CM | POA: Diagnosis not present

## 2020-04-06 DIAGNOSIS — Z87891 Personal history of nicotine dependence: Secondary | ICD-10-CM | POA: Diagnosis not present

## 2020-04-06 DIAGNOSIS — E785 Hyperlipidemia, unspecified: Secondary | ICD-10-CM | POA: Diagnosis not present

## 2020-04-18 DIAGNOSIS — N952 Postmenopausal atrophic vaginitis: Secondary | ICD-10-CM | POA: Diagnosis not present

## 2020-04-18 DIAGNOSIS — Z87440 Personal history of urinary (tract) infections: Secondary | ICD-10-CM | POA: Diagnosis not present

## 2020-04-18 DIAGNOSIS — R350 Frequency of micturition: Secondary | ICD-10-CM | POA: Diagnosis not present

## 2020-04-19 ENCOUNTER — Other Ambulatory Visit: Payer: Self-pay

## 2020-04-19 ENCOUNTER — Ambulatory Visit
Admission: RE | Admit: 2020-04-19 | Discharge: 2020-04-19 | Disposition: A | Discharge status: Home / Self Care | Payer: Medicare PPO | Source: Ambulatory Visit | Attending: Family Medicine | Admitting: Family Medicine

## 2020-04-19 DIAGNOSIS — Z1231 Encounter for screening mammogram for malignant neoplasm of breast: Secondary | ICD-10-CM

## 2020-04-21 ENCOUNTER — Other Ambulatory Visit: Payer: Self-pay | Admitting: Cardiology

## 2020-05-03 ENCOUNTER — Ambulatory Visit: Payer: Medicare PPO | Admitting: Cardiology

## 2020-05-03 ENCOUNTER — Encounter: Payer: Self-pay | Admitting: Cardiology

## 2020-05-03 ENCOUNTER — Other Ambulatory Visit: Payer: Self-pay

## 2020-05-03 VITALS — BP 136/78 | HR 65 | Ht 65.0 in | Wt 175.8 lb

## 2020-05-03 DIAGNOSIS — E785 Hyperlipidemia, unspecified: Secondary | ICD-10-CM

## 2020-05-03 DIAGNOSIS — H34213 Partial retinal artery occlusion, bilateral: Secondary | ICD-10-CM

## 2020-05-03 DIAGNOSIS — I10 Essential (primary) hypertension: Secondary | ICD-10-CM | POA: Diagnosis not present

## 2020-05-03 DIAGNOSIS — R002 Palpitations: Secondary | ICD-10-CM | POA: Diagnosis not present

## 2020-05-03 DIAGNOSIS — E782 Mixed hyperlipidemia: Secondary | ICD-10-CM

## 2020-05-03 NOTE — Patient Instructions (Signed)
Medication Instructions:  NO CHANGES  *If you need a refill on your cardiac medications before your next appointment, please call your pharmacy*   Lab Work: NOT NEEDED If you have labs (blood work) drawn today and your tests are completely normal, you will receive your results only by: Marland Kitchen MyChart Message (if you have MyChart) OR . A paper copy in the mail If you have any lab test that is abnormal or we need to change your treatment, we will call you to review the results.   Testing/Procedures: NOT NEEDED   Follow-Up: At [facility], you and your health needs are our priority.  As part of our continuing mission to provide you with exceptional heart care, we have created designated Provider Care Teams.  These Care Teams include your primary Cardiologist (physician) and Advanced Practice Providers (APPs -  Physician Assistants and Nurse Practitioners) who all work together to provide you with the care you need, when you need it.      Your next appointment:   12 month(s)  The format for your next appointment:   In Person  Provider:   Glenetta Hew, MD   Other Instructions BLOOD PRESSURE  RANGE SHOULD BE 13 0/80 - CUFFS NAME  OMRON   MEDIUM PRICE  - UPPER ARM CUFF

## 2020-05-03 NOTE — Progress Notes (Signed)
Primary Care Provider: Sharilyn Sites, MD Cardiologist: Glenetta Hew, MD Electrophysiologist: None  Clinic Note: Chief Complaint  Patient presents with  . Follow-up    Annual  . Palpitations    Well-controlled  . Hypertension    Borderline, but under lots of stress of late.    HPI:    Danielle [name] is a 79 y.o. female with a PMH notable for cardiac risk factors including hypertension, hyperlipidemia (Hollenhorst plaque in the eyes) and family history of CAD with intermittent palpitations who presents today for annual follow-up.  Problem List Items Addressed This Visit    Intermittent palpitations (Chronic)   Essential hypertension - Primary (Chronic)   Hyperlipidemia (Chronic)   Hollenhorst plaque, both eyes     Danielle [name] was last seen on April 15, 2019-she was recovering from COVID-19.  She is still felt somewhat weak and lethargic.  Lost her taste and smell.  Never really had fever or dyspnea.  Vague off and on pinprick-like chest discomfort symptoms.  Deconditioned-out of shape dyspnea. -> Converted to 40 mg of simvastatin.  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 None:   Interval History:   Danielle [name] returns here today overall doing quite well. Palpitations seem to be pretty well controlled. No major issues. She just been worn out because her husband has been in and out of the hospital with bowel obstruction complicated by A. fib. She has been pretty fatigued and worn out because of going back and forth to the hospital this time. She has not really been able to exercise and but has been trying to maintain a steady diet and eating well. She thinks to be gained some weight and her blood pressure may have gone up a little bit. With some fatigue,  she has had occasional palpitations but nothing significant. She says she may get out of breath if she goes up and down few flights of steps, but she has not really been exercising now. She actually has started working out now on exercise bike. This is a new step for her. Otherwise pretty much asymptomatic.  CV Review of Symptoms (Summary): positive for - dyspnea on exertion and Some exercise intolerance because of deconditioning. negative for - chest pain, edema, irregular heartbeat, orthopnea, palpitations, paroxysmal nocturnal dyspnea, rapid heart rate, shortness of breath or Syncope/near syncope or TIA/amaurosis fugax, claudication  The patient does not have symptoms concerning for COVID-19 infection (fever, chills, cough, or new shortness of breath).   REVIEWED OF SYSTEMS   Review of Systems  Constitutional: Negative for malaise/fatigue (Just tired from going back and forth to the hospital.) and weight loss.  HENT: Negative for congestion and nosebleeds.   Respiratory: Negative for cough and shortness of breath.   Gastrointestinal: Negative for abdominal pain, blood in stool and melena.  Genitourinary: Negative for hematuria.  Musculoskeletal: Negative for back pain, falls and joint pain.  Neurological: Negative for dizziness, focal weakness, weakness and headaches.  Psychiatric/Behavioral: Negative for depression and memory loss. The patient is not nervous/anxious and does not have insomnia.    I have reviewed and (if needed) personally updated the patient's problem list, medications, allergies, past medical and surgical history, social and family history.   PAST MEDICAL HISTORY   Past Medical History:  Diagnosis Date  . Anxiety   .  Arthritis   . Asthma    seasonal; spring/fall  . Diabetes mellitus without complication ([place])   . Dyslipidemia   . Family history of early CAD    Dobutamine Cardiolite Study 04/03/2002--Ejection Fraction on post stress images is 76%  . GERD  (gastroesophageal reflux disease)   . Glaucoma   . Hypertension   . Neutropenia, cyclic (HCC)    Benign, followed by hematology  . PONV (postoperative nausea and vomiting)   . Sleep apnea    CPAP at night  . Thrombocytopenia ([place])     PAST SURGICAL HISTORY   Past Surgical History:  Procedure Laterality Date  . COLONOSCOPY  08/09/2008     VHQ:IONGEX rectum, few right-sided diverticula, diminutive  polypoid mucosa at the ileocecal valve status post cold biopsy removal Remainder of colonic mucosa appeared normal  . COLONOSCOPY, ESOPHAGOGASTRODUODENOSCOPY (EGD) AND ESOPHAGEAL DILATION N/A 09/29/2013   Procedure: COLONOSCOPY, ESOPHAGOGASTRODUODENOSCOPY (EGD) AND ESOPHAGEAL DILATION (ED);  Surgeon: Daneil Dolin, MD;  Location: [facility];  Service: Endoscopy;  Laterality: N/A;  12:15  . KNEE ARTHROSCOPY  07/19/2011   Procedure: ARTHROSCOPY KNEE;  Surgeon: Sanjuana Kava;  Location: [facility];  Service: Orthopedics;  Laterality: Left;  partial medial menisectomy anterior horn, removal of loose body  . KNEE SURGERY    . NM MYOVIEW LTD  09/2015   LOW RISK EF 66%, hyperdynamic. HTN response to exercise. NO EKG changes. No Ischemia or Infarct.    . TONSILLECTOMY  1972  . TRANSTHORACIC ECHOCARDIOGRAM  2003   EF normal, moderate asymmetric LVH; AV tri-leaflet, mild aortic sclerosis, mild mitral annular calcification, mild MR, mild TR  . [place], partial    Immunization History  Administered Date(s) Administered  . Moderna SARS-COVID-2 Vaccination 07/28/2019, 08/29/2019    MEDICATIONS/ALLERGIES   Current Meds  Medication Sig  . albuterol (PROVENTIL HFA;VENTOLIN HFA) 108 (90 BASE) MCG/ACT inhaler Inhale 2 puffs into the lungs every 6 (six) hours as needed for wheezing. For shortness of breath  . ALOE VERA JUICE LIQD Take 1 Bottle by mouth.   Marland Kitchen aspirin 81 MG tablet Take 81 mg by mouth daily.  . fluticasone (FLOVENT HFA) 110 MCG/ACT inhaler Inhale 1 puff into  the lungs daily as needed (FOR SHORTNESS OF BREATH). For shortness of breath  . hydrochlorothiazide (HYDRODIURIL) 25 MG tablet TAKE 1 TABLET(25 MG) BY MOUTH DAILY  . Multiple Vitamins-Minerals (CENTRUM SILVER PO) Take 1 capsule by mouth daily.  . NON FORMULARY Take by mouth daily as needed. Aloe vera juice  . NON FORMULARY at bedtime. Uses cpap at night  . Omega-3 Fatty Acids (FISH OIL) 1000 MG CAPS Take 1 capsule by mouth daily.  . Potassium 99 MG TABS Take 1 tablet by mouth daily as needed (SUPPLEMENT). Takes infrequently with no particular schedule  . simvastatin (ZOCOR) 10 MG tablet Take 10 mg by mouth daily.  . vitamin C (ASCORBIC ACID) 500 MG tablet Take 1 tablet by mouth daily.  Marland Kitchen VITAMIN D, ERGOCALCIFEROL, PO Take by mouth daily.  . [DISCONTINUED] lansoprazole (PREVACID) 30 MG capsule TAKE 1 CAPSULE BY MOUTH EVERY DAY    Allergies  Allergen Reactions  . Trilyte [Peg 3350-Kcl-Na Bicarb-Nacl] Swelling    Swelling of tongue    SOCIAL HISTORY/FAMILY HISTORY   Reviewed in Epic:  Pertinent findings: staying active.  Eating right.   OBJCTIVE -PE, EKG, labs    Wt Readings from Last 3 Encounters:  05/03/20 175 lb 12.8 oz (79.7 kg)  05/26/19  173 lb (78.5 kg)  04/15/19 172 lb (78 kg)    Physical Exam: BP 136/78   Pulse 65   Ht 5\' 5"  (1.651 m)   Wt 175 lb 12.8 oz (79.7 kg)   BMI 29.25 kg/m  Physical Exam Vitals reviewed.  Constitutional:      General: She is not in acute distress.    Appearance: Normal appearance. She is not ill-appearing or toxic-appearing.     Comments: Well-groomed. Healthy-appearing. Borderline obese by BMI, but does not seem to be "fat ".  HENT:     Head: Normocephalic and atraumatic.  Neck:     Vascular: No carotid bruit or JVD.  Cardiovascular:     Rate and Rhythm: Normal rate and regular rhythm.  No extrasystoles are present.    Chest Wall: PMI is not displaced.     Pulses: Normal pulses.     Heart sounds: Normal heart sounds. No murmur  heard.  No friction rub. No gallop.   Pulmonary:     Effort: Pulmonary effort is normal. No respiratory distress.     Breath sounds: Normal breath sounds.  Chest:     Chest wall: No tenderness.  Musculoskeletal:        General: No swelling. Normal range of motion.     Cervical back: Normal range of motion and neck supple.  Neurological:     General: No focal deficit present.     Mental Status: She is alert and oriented to person, place, and time.  Psychiatric:        Mood and Affect: Mood normal.        Behavior: Behavior normal.        Thought Content: Thought content normal.        Judgment: Judgment normal.     Adult ECG Report  Rate: 65;  Rhythm: normal sinus rhythm, sinus arrhythmia and I RBBB, cannot exclude anterior MI, age undetermined. Borderline low voltage.; Otherwise normal axis, intervals and durations.  Narrative Interpretation: Stable EKG.  Recent Labs: Last checked by PCP 09/24/2019: TC 197, TG 82, HDL 77 LDL 105. Hgb 12.7. A1c 6.0. Cr 0.72, K+ 4.1. No results found for: CHOL, HDL, LDLCALC, LDLDIRECT, TRIG, CHOLHDL  No results found for: TSH-TSH 2.18  ASSESSMENT/PLAN    Problem List Items Addressed This Visit    Hyperlipidemia with target LDL less than 100 (Chronic)    Most recent LDL was 105. Previously been under 100. Closely monitor. She is currently taking low-dose simvastatin. If LDL continues to drift up, may need to either titrate up to 20 mg or switch to a more potent statin.  Labs being followed by PCP.      Intermittent palpitations (Chronic)    Still doing very well. Palpitations seem to have been related to stress and anxiety because she did have some when she was visiting her husband in the hospital during his illness. No longer present now. We are trying to avoid AV nodal agent therapy to avoid fatigue, and bradycardia.      Relevant Orders   EKG 12-Lead (Completed)   Essential hypertension - Primary (Chronic)    Blood pressure today is  borderline. She is quite stressed out and fatigue. I have asked that she can keep blood pressures. She is only on HCTZ. Would prefer to avoid adding additional agent unless her pressures are persistently elevated.  Target blood pressures less than 135/80 mmHg.      Relevant Orders   EKG 12-Lead (Completed)   Hollenhorst  plaque, both eyes    Normal carotid Dopplers normal echo as part of his evaluation. Now major plan is lipid management And other prescribed medication.          COVID-19 Education: The signs and symptoms of COVID-19 were discussed with the patient and how to seek care for testing (follow up with PCP or arrange E-visit).   The importance of social distancing and COVID-19 vaccination was discussed today. 2 min he is waiting for the Moderna vaccine to come out with a booster.  The patient is practicing social distancing & Masking.   I spent a total of 25 minutes with the patient spent in direct patient consultation.  Additional time spent with chart review  / charting (studies, outside notes, etc): 10 Total Time: 35 min   Current medicines are reviewed at length with the patient today.  (+/- concerns) n/a  This visit occurred during the SARS-CoV-2 public health emergency.  Safety protocols were in place, including screening questions prior to the visit, additional usage of staff PPE, and extensive cleaning of exam room while observing appropriate contact time as indicated for disinfecting solutions.  Notice: This dictation was prepared with Dragon dictation along with smaller phrase technology. Any transcriptional errors that result from this process are unintentional and may not be corrected upon review.  Patient Instructions / Medication Changes & Studies & Tests Ordered   Patient Instructions  Medication Instructions:  NO CHANGES  *If you need a refill on your cardiac medications before your next appointment, please call your pharmacy*   Lab Work: NOT  NEEDED If you have labs (blood work) drawn today and your tests are completely normal, you will receive your results only by: Marland Kitchen MyChart Message (if you have MyChart) OR . A paper copy in the mail If you have any lab test that is abnormal or we need to change your treatment, we will call you to review the results.   Testing/Procedures: NOT NEEDED   Follow-Up: At [facility], you and your health needs are our priority.  As part of our continuing mission to provide you with exceptional heart care, we have created designated Provider Care Teams.  These Care Teams include your primary Cardiologist (physician) and Advanced Practice Providers (APPs -  Physician Assistants and Nurse Practitioners) who all work together to provide you with the care you need, when you need it.      Your next appointment:   12 month(s)  The format for your next appointment:   In Person  Provider:   Glenetta Hew, MD   Other Instructions BLOOD PRESSURE  RANGE SHOULD BE 13 0/80 - CUFFS NAME  OMRON   MEDIUM PRICE  - UPPER ARM CUFF     Studies Ordered:   Orders Placed This Encounter  Procedures  . EKG 12-Lead     Glenetta Hew, M.D., M.S. Interventional Cardiologist   Pager # [phone] Phone # [phone] [address] [place], [place] [address]   Thank you for choosing Heartcare at [facility]!!

## 2020-05-04 DIAGNOSIS — Z23 Encounter for immunization: Secondary | ICD-10-CM | POA: Diagnosis not present

## 2020-05-11 DIAGNOSIS — Z6830 Body mass index (BMI) 30.0-30.9, adult: Secondary | ICD-10-CM | POA: Diagnosis not present

## 2020-05-11 DIAGNOSIS — R946 Abnormal results of thyroid function studies: Secondary | ICD-10-CM | POA: Diagnosis not present

## 2020-05-11 DIAGNOSIS — E7849 Other hyperlipidemia: Secondary | ICD-10-CM | POA: Diagnosis not present

## 2020-05-11 DIAGNOSIS — R7309 Other abnormal glucose: Secondary | ICD-10-CM | POA: Diagnosis not present

## 2020-05-13 ENCOUNTER — Encounter: Payer: Self-pay | Admitting: Cardiology

## 2020-05-13 NOTE — Assessment & Plan Note (Signed)
Still doing very well. Palpitations seem to have been related to stress and anxiety because she did have some when she was visiting her husband in the hospital during his illness. No longer present now. We are trying to avoid AV nodal agent therapy to avoid fatigue, and bradycardia.

## 2020-05-13 NOTE — Assessment & Plan Note (Signed)
Blood pressure today is borderline. She is quite stressed out and fatigue. I have asked that she can keep blood pressures. She is only on HCTZ. Would prefer to avoid adding additional agent unless her pressures are persistently elevated.  Target blood pressures less than 135/80 mmHg.

## 2020-05-13 NOTE — Assessment & Plan Note (Signed)
Normal carotid Dopplers normal echo as part of his evaluation. Now major plan is lipid management And other prescribed medication.

## 2020-05-13 NOTE — Assessment & Plan Note (Signed)
Most recent LDL was 105. Previously been under 100. Closely monitor. She is currently taking low-dose simvastatin. If LDL continues to drift up, may need to either titrate up to 20 mg or switch to a more potent statin.  Labs being followed by PCP.

## 2020-05-18 DIAGNOSIS — R946 Abnormal results of thyroid function studies: Secondary | ICD-10-CM | POA: Diagnosis not present

## 2020-05-18 DIAGNOSIS — Z961 Presence of intraocular lens: Secondary | ICD-10-CM | POA: Diagnosis not present

## 2020-05-18 DIAGNOSIS — H401232 Low-tension glaucoma, bilateral, moderate stage: Secondary | ICD-10-CM | POA: Diagnosis not present

## 2020-05-18 DIAGNOSIS — H52223 Regular astigmatism, bilateral: Secondary | ICD-10-CM | POA: Diagnosis not present

## 2020-05-18 DIAGNOSIS — D709 Neutropenia, unspecified: Secondary | ICD-10-CM | POA: Diagnosis not present

## 2020-05-18 DIAGNOSIS — H5203 Hypermetropia, bilateral: Secondary | ICD-10-CM | POA: Diagnosis not present

## 2020-05-18 DIAGNOSIS — Z6830 Body mass index (BMI) 30.0-30.9, adult: Secondary | ICD-10-CM | POA: Diagnosis not present

## 2020-05-18 DIAGNOSIS — H524 Presbyopia: Secondary | ICD-10-CM | POA: Diagnosis not present

## 2020-05-18 DIAGNOSIS — E6609 Other obesity due to excess calories: Secondary | ICD-10-CM | POA: Diagnosis not present

## 2020-07-01 DIAGNOSIS — G4733 Obstructive sleep apnea (adult) (pediatric): Secondary | ICD-10-CM | POA: Diagnosis not present

## 2020-08-16 DIAGNOSIS — H26493 Other secondary cataract, bilateral: Secondary | ICD-10-CM | POA: Diagnosis not present

## 2020-08-16 DIAGNOSIS — H26492 Other secondary cataract, left eye: Secondary | ICD-10-CM | POA: Diagnosis not present

## 2020-08-16 DIAGNOSIS — H26491 Other secondary cataract, right eye: Secondary | ICD-10-CM | POA: Diagnosis not present

## 2020-08-16 DIAGNOSIS — H401232 Low-tension glaucoma, bilateral, moderate stage: Secondary | ICD-10-CM | POA: Diagnosis not present

## 2020-09-01 ENCOUNTER — Encounter: Payer: Self-pay | Admitting: Internal Medicine

## 2020-09-07 DIAGNOSIS — Z1331 Encounter for screening for depression: Secondary | ICD-10-CM | POA: Diagnosis not present

## 2020-09-07 DIAGNOSIS — M62830 Muscle spasm of back: Secondary | ICD-10-CM | POA: Diagnosis not present

## 2020-09-07 DIAGNOSIS — I10 Essential (primary) hypertension: Secondary | ICD-10-CM | POA: Diagnosis not present

## 2020-09-07 DIAGNOSIS — D696 Thrombocytopenia, unspecified: Secondary | ICD-10-CM | POA: Diagnosis not present

## 2020-09-07 DIAGNOSIS — Z6829 Body mass index (BMI) 29.0-29.9, adult: Secondary | ICD-10-CM | POA: Diagnosis not present

## 2020-09-07 DIAGNOSIS — M5417 Radiculopathy, lumbosacral region: Secondary | ICD-10-CM | POA: Diagnosis not present

## 2020-09-07 DIAGNOSIS — E663 Overweight: Secondary | ICD-10-CM | POA: Diagnosis not present

## 2020-09-14 ENCOUNTER — Other Ambulatory Visit: Payer: Self-pay

## 2020-09-14 ENCOUNTER — Encounter: Payer: Self-pay | Admitting: Obstetrics & Gynecology

## 2020-09-14 ENCOUNTER — Ambulatory Visit: Payer: Medicare PPO | Admitting: Obstetrics & Gynecology

## 2020-09-14 VITALS — BP 133/76 | HR 65 | Ht 65.0 in | Wt 175.0 lb

## 2020-09-14 DIAGNOSIS — L723 Sebaceous cyst: Secondary | ICD-10-CM

## 2020-09-14 NOTE — Progress Notes (Signed)
   GYN VISIT Patient name: Danielle [name] MRN [id number]  Date of birth: 08-18-1940 Chief Complaint:   cyst/boil in vaginal area  History of Present Illness:   Danielle [name] is a 80 y.o. G40P1001 African American female being seen today for a "vulvar bump." She notes that on the right labia there is a small bump that seems to have gotten bigger.  States it has been present for ~ 23mos. No bleeding, discharge, itching or pain. She has not applied any treatment.  No fever or chills.  No other acute complaints  She was very concerned about potential increase in size as she is traveling- her son is due in July with her first grandbaby (little girl).    Depression screen [facility] 2/9 09/14/2020 10/28/2014  Decreased Interest 0 0  Down, Depressed, Hopeless 0 0  PHQ - 2 Score 0 0  Altered sleeping 0 -  Tired, decreased energy 0 -  Change in appetite 0 -  Feeling bad or failure about yourself  0 -  Trouble concentrating 0 -  Moving slowly or fidgety/restless 0 -  Suicidal thoughts 0 -  PHQ-9 Score 0 -    No LMP recorded. Patient has had a hysterectomy.  Review of Systems:   Pertinent items are noted in HPI Denies fever/chills, dizziness, headaches, visual disturbances, fatigue, shortness of breath, chest pain, abdominal pain, vomiting, abnormal vaginal discharge/itching/odor/irritation, problems with periods, bowel movements, urination, or intercourse unless otherwise stated above.  Pertinent History Reviewed:  Reviewed past medical,surgical, social, obstetrical and family history.  Reviewed problem list, medications and allergies. Physical Assessment:   Vitals:   09/14/20 0913  BP: 133/76  Pulse: 65  Weight: 175 lb (79.4 kg)  Height: 5\' 5"  (1.651 m)  Body mass index is 29.12 kg/m.       Physical Examination:   General appearance: alert, well appearing, and in no distress  Mental status: alert, oriented to person, place, and time  Skin: warm & dry   Cardiovascular: normal heart  rate noted  Respiratory: normal respiratory effort, no distress  Abdomen: soft, non-tender, no rebound, no guarding  Pelvic: VULVA: right labia at base ~ 1cm (pea-sized) non-tender, skin-colored firm cyst- c/w sebaceous cyst, left vulva unremarkable, vagina mucosa- pink, no abnormalities noted, uterus surgically absent  Extremities: no edema   Chaperone: Levy Pupa    No results found for this or any previous visit (from the past 24 hour(s)).  Assessment & Plan:  1) Sebaceous cyst Reassured pt of benign finding Discussed conservative treatment- warm compress Should she note worsening of size or symptoms would consider drainage    Meds: No orders of the defined types were placed in this encounter.   No orders of the defined types were placed in this encounter.   Return in about 14 months (around 11/06/2021) for F/U regarding sebaceous cyst.  Janyth Pupa, DO Attending [place], [place] for [facility], [place]

## 2020-09-14 NOTE — Patient Instructions (Signed)
Sebaceous cyst- normal finding.  No treatment is necessary; however, if desired you can apply a warm compress daily.  Should you notice significant increase in size, pain or other symptoms, please call the office

## 2020-09-29 DIAGNOSIS — G4733 Obstructive sleep apnea (adult) (pediatric): Secondary | ICD-10-CM | POA: Diagnosis not present

## 2020-09-29 DIAGNOSIS — E7849 Other hyperlipidemia: Secondary | ICD-10-CM | POA: Diagnosis not present

## 2020-09-29 DIAGNOSIS — M5410 Radiculopathy, site unspecified: Secondary | ICD-10-CM | POA: Diagnosis not present

## 2020-09-29 DIAGNOSIS — E6609 Other obesity due to excess calories: Secondary | ICD-10-CM | POA: Diagnosis not present

## 2020-09-29 DIAGNOSIS — Z6830 Body mass index (BMI) 30.0-30.9, adult: Secondary | ICD-10-CM | POA: Diagnosis not present

## 2020-10-11 DIAGNOSIS — H26493 Other secondary cataract, bilateral: Secondary | ICD-10-CM | POA: Diagnosis not present

## 2020-10-11 DIAGNOSIS — H524 Presbyopia: Secondary | ICD-10-CM | POA: Diagnosis not present

## 2020-10-11 DIAGNOSIS — H5203 Hypermetropia, bilateral: Secondary | ICD-10-CM | POA: Diagnosis not present

## 2020-10-11 DIAGNOSIS — H401232 Low-tension glaucoma, bilateral, moderate stage: Secondary | ICD-10-CM | POA: Diagnosis not present

## 2020-10-11 DIAGNOSIS — H52223 Regular astigmatism, bilateral: Secondary | ICD-10-CM | POA: Diagnosis not present

## 2020-10-11 DIAGNOSIS — Z961 Presence of intraocular lens: Secondary | ICD-10-CM | POA: Diagnosis not present

## 2020-10-18 ENCOUNTER — Other Ambulatory Visit: Payer: Self-pay | Admitting: Cardiology

## 2020-11-02 DIAGNOSIS — Z1331 Encounter for screening for depression: Secondary | ICD-10-CM | POA: Diagnosis not present

## 2020-11-02 DIAGNOSIS — G4761 Periodic limb movement disorder: Secondary | ICD-10-CM | POA: Diagnosis not present

## 2020-11-02 DIAGNOSIS — Z0000 Encounter for general adult medical examination without abnormal findings: Secondary | ICD-10-CM | POA: Diagnosis not present

## 2020-11-02 DIAGNOSIS — K219 Gastro-esophageal reflux disease without esophagitis: Secondary | ICD-10-CM | POA: Diagnosis not present

## 2020-11-02 DIAGNOSIS — J4520 Mild intermittent asthma, uncomplicated: Secondary | ICD-10-CM | POA: Diagnosis not present

## 2020-11-02 DIAGNOSIS — Z6830 Body mass index (BMI) 30.0-30.9, adult: Secondary | ICD-10-CM | POA: Diagnosis not present

## 2020-11-02 DIAGNOSIS — R946 Abnormal results of thyroid function studies: Secondary | ICD-10-CM | POA: Diagnosis not present

## 2020-11-02 DIAGNOSIS — D709 Neutropenia, unspecified: Secondary | ICD-10-CM | POA: Diagnosis not present

## 2020-11-02 DIAGNOSIS — I10 Essential (primary) hypertension: Secondary | ICD-10-CM | POA: Diagnosis not present

## 2020-11-09 ENCOUNTER — Ambulatory Visit: Payer: Medicare PPO | Admitting: Obstetrics & Gynecology

## 2020-11-28 DIAGNOSIS — Z1389 Encounter for screening for other disorder: Secondary | ICD-10-CM | POA: Diagnosis not present

## 2020-11-28 DIAGNOSIS — R7989 Other specified abnormal findings of blood chemistry: Secondary | ICD-10-CM | POA: Diagnosis not present

## 2020-11-28 DIAGNOSIS — Z0000 Encounter for general adult medical examination without abnormal findings: Secondary | ICD-10-CM | POA: Diagnosis not present

## 2020-11-28 DIAGNOSIS — K219 Gastro-esophageal reflux disease without esophagitis: Secondary | ICD-10-CM | POA: Diagnosis not present

## 2020-12-30 DIAGNOSIS — G4733 Obstructive sleep apnea (adult) (pediatric): Secondary | ICD-10-CM | POA: Diagnosis not present

## 2021-01-16 DIAGNOSIS — K5732 Diverticulitis of large intestine without perforation or abscess without bleeding: Secondary | ICD-10-CM | POA: Diagnosis not present

## 2021-01-16 DIAGNOSIS — R1032 Left lower quadrant pain: Secondary | ICD-10-CM | POA: Diagnosis not present

## 2021-01-16 DIAGNOSIS — Z6830 Body mass index (BMI) 30.0-30.9, adult: Secondary | ICD-10-CM | POA: Diagnosis not present

## 2021-01-16 DIAGNOSIS — E6609 Other obesity due to excess calories: Secondary | ICD-10-CM | POA: Diagnosis not present

## 2021-01-20 DIAGNOSIS — H5203 Hypermetropia, bilateral: Secondary | ICD-10-CM | POA: Diagnosis not present

## 2021-01-20 DIAGNOSIS — H524 Presbyopia: Secondary | ICD-10-CM | POA: Diagnosis not present

## 2021-01-20 DIAGNOSIS — H26493 Other secondary cataract, bilateral: Secondary | ICD-10-CM | POA: Diagnosis not present

## 2021-01-20 DIAGNOSIS — H52223 Regular astigmatism, bilateral: Secondary | ICD-10-CM | POA: Diagnosis not present

## 2021-01-20 DIAGNOSIS — H401232 Low-tension glaucoma, bilateral, moderate stage: Secondary | ICD-10-CM | POA: Diagnosis not present

## 2021-01-20 DIAGNOSIS — Z961 Presence of intraocular lens: Secondary | ICD-10-CM | POA: Diagnosis not present

## 2021-03-08 ENCOUNTER — Encounter: Payer: Self-pay | Admitting: Gastroenterology

## 2021-03-10 ENCOUNTER — Other Ambulatory Visit: Payer: Self-pay | Admitting: Family Medicine

## 2021-03-10 DIAGNOSIS — Z1231 Encounter for screening mammogram for malignant neoplasm of breast: Secondary | ICD-10-CM

## 2021-03-14 ENCOUNTER — Other Ambulatory Visit: Payer: Self-pay

## 2021-03-14 ENCOUNTER — Ambulatory Visit: Payer: Medicare PPO | Admitting: Gastroenterology

## 2021-03-14 ENCOUNTER — Telehealth: Payer: Self-pay | Admitting: *Deleted

## 2021-03-14 ENCOUNTER — Encounter: Payer: Self-pay | Admitting: *Deleted

## 2021-03-14 ENCOUNTER — Encounter: Payer: Self-pay | Admitting: Gastroenterology

## 2021-03-14 VITALS — BP 145/76 | HR 66 | Temp 98.2°F | Ht 64.0 in | Wt 177.2 lb

## 2021-03-14 DIAGNOSIS — Z86010 Personal history of colonic polyps: Secondary | ICD-10-CM

## 2021-03-14 DIAGNOSIS — K5900 Constipation, unspecified: Secondary | ICD-10-CM | POA: Diagnosis not present

## 2021-03-14 NOTE — Telephone Encounter (Signed)
PA approved via [employer]. Auth# [id number], DOS Apr 05 2021 - May 05 2021

## 2021-03-14 NOTE — Progress Notes (Signed)
Primary Care Physician: Sharilyn Sites, MD  Primary Gastroenterologist:  Garfield Cornea, MD   Chief Complaint  Patient presents with   Colonoscopy    Due for tcs. Had llq pain 3rd week 02/2021. Took antibiotics for possible diverticulitis, pain resolved     HPI: Danielle [name] is a 80 y.o. female here to consider surveillance colonoscopy for history of adenomatous colon polyps.  She developed tongue swelling with TriLyte during her last colonoscopy in 2015.  Did not require ED intervention.  Completed antibiotics 2 weeks ago were given to her by her PCP for presumed diverticulitis.  Left lower quadrant pain resolved.  She is being careful with her diet.  Has always been a healthy eater.  Lots of fiber in her diet.  Trying to avoid seeds.  Typically has a BM about 3-4 times per week.  Some [place] 1. No melena, brbpr. No heartburn, on lansoprazole '30mg'$  daily as needed. Takes most days. No dysphagia. No n/v. No unintentional weight loss.  Patient desires another colonoscopy at this time.  EGD March 2015: Impression: -Esophageal ring status post disruption  Colonoscopy March 2015: Impression: -Diverticulosis -Single tubular adenoma removed -Next colonoscopy in 7 years.  Current Outpatient Medications  Medication Sig Dispense Refill   albuterol (PROVENTIL HFA;VENTOLIN HFA) 108 (90 BASE) MCG/ACT inhaler Inhale 2 puffs into the lungs every 6 (six) hours as needed for wheezing. For shortness of breath     aspirin 81 MG tablet Take 81 mg by mouth daily.     fluticasone (FLOVENT HFA) 110 MCG/ACT inhaler Inhale 1 puff into the lungs daily as needed (FOR SHORTNESS OF BREATH). For shortness of breath     hydrochlorothiazide (HYDRODIURIL) 25 MG tablet TAKE 1 TABLET(25 MG) BY MOUTH DAILY 90 tablet 1   ibuprofen (ADVIL) 200 MG tablet Take 600-800 mg by mouth as needed.     lansoprazole (PREVACID) 30 MG capsule Take 30 mg by mouth daily before breakfast.     Multiple Vitamins-Minerals  (CENTRUM SILVER PO) Take 1 capsule by mouth daily.     NON FORMULARY at bedtime. Uses cpap at night     Omega-3 Fatty Acids (FISH OIL) 1000 MG CAPS Take 1 capsule by mouth daily.     Potassium 99 MG TABS Take 1 tablet by mouth daily as needed (SUPPLEMENT). Takes infrequently with no particular schedule     rosuvastatin (CRESTOR) 10 MG tablet Take 10 mg by mouth daily.     vitamin C (ASCORBIC ACID) 500 MG tablet Take 1 tablet by mouth as needed.     VITAMIN D, ERGOCALCIFEROL, PO Take by mouth daily.     No current facility-administered medications for this visit.    Allergies as of 03/14/2021 - Review Complete 03/14/2021  Allergen Reaction Noted   Trilyte [peg [id number] bicarb-nacl] Swelling 09/29/2013   Past Medical History:  Diagnosis Date   Anxiety    Arthritis    Asthma    seasonal; spring/fall   Diabetes mellitus without complication ([place])    Dyslipidemia    Family history of early CAD    Dobutamine Cardiolite Study 04/03/2002--Ejection Fraction on post stress images is 76%   GERD (gastroesophageal reflux disease)    Glaucoma    Hypertension    Neutropenia, cyclic (HCC)    Benign, followed by hematology   PONV (postoperative nausea and vomiting)    Sleep apnea    CPAP at night   Thrombocytopenia [facility])    Past Surgical History:  Procedure Laterality  Date   COLONOSCOPY  08/09/2008     [id number] rectum, few right-sided diverticula, diminutive  polypoid mucosa at the ileocecal valve status post cold biopsy removal Remainder of colonic mucosa appeared normal   COLONOSCOPY, ESOPHAGOGASTRODUODENOSCOPY (EGD) AND ESOPHAGEAL DILATION N/A 09/29/2013   Procedure: COLONOSCOPY, ESOPHAGOGASTRODUODENOSCOPY (EGD) AND ESOPHAGEAL DILATION (ED);  Surgeon: Daneil Dolin, MD;  [facility];  Service: Endoscopy;  Laterality: N/A;  12:15   KNEE ARTHROSCOPY  07/19/2011   Procedure: ARTHROSCOPY KNEE;  Surgeon: Sanjuana Kava;  Location: [facility] ORS;  Service: Orthopedics;  Laterality: Left;   partial medial menisectomy anterior horn, removal of loose body   KNEE SURGERY     NM MYOVIEW LTD  09/2015   LOW RISK EF 66%, hyperdynamic. HTN response to exercise. NO EKG changes. No Ischemia or Infarct.     TONSILLECTOMY  1972   TRANSTHORACIC ECHOCARDIOGRAM  2003   EF normal, moderate asymmetric LVH; AV tri-leaflet, mild aortic sclerosis, mild mitral annular calcification, mild MR, mild TR   VAGINAL HYSTERECTOMY  1985   [place], partial   Family History  Problem Relation Age of Onset   Leukemia Mother    Hyperlipidemia Mother    Heart disease Father    Prostate cancer Maternal Grandfather    Kidney failure Paternal Grandmother    Gastric cancer Paternal Grandmother    Heart disease Paternal Grandfather    Diabetes Brother    Clotting disorder Brother        blood clots   Hypotension Neg Hx    Malignant hyperthermia Neg Hx    Pseudochol deficiency Neg Hx    Colon cancer Neg Hx    Breast cancer Neg Hx    Social History   Tobacco Use   Smoking status: Former    Packs/day: 0.25    Years: 8.50    Pack years: 2.13    Types: Cigarettes    Quit date: 07/15/1978    Years since quitting: 42.6   Smokeless tobacco: Never  Vaping Use   Vaping Use: Never used  Substance Use Topics   Alcohol use: Not Currently    Comment: occasional   Drug use: No    ROS:  General: Negative for anorexia, weight loss, fever, chills, fatigue, weakness. ENT: Negative for hoarseness, difficulty swallowing , nasal congestion. CV: Negative for chest pain, angina, palpitations, dyspnea on exertion, peripheral edema.  Respiratory: Negative for dyspnea at rest, dyspnea on exertion, cough, sputum, wheezing.  GI: See history of present illness. GU:  Negative for dysuria, hematuria, urinary incontinence, urinary frequency, nocturnal urination.  Endo: Negative for unusual weight change.    Physical Examination:   BP (!) 145/76   Pulse 66   Temp 98.2 F (36.8 C) (Temporal)   Ht '5\' 4"'$  (1.626 m)    Wt 177 lb 3.2 oz (80.4 kg)   BMI 30.42 kg/m   General: Well-nourished, well-developed in no acute distress.  Eyes: No icterus. Mouth: masked Lungs: Clear to auscultation bilaterally.  Heart: Regular rate and rhythm, no murmurs rubs or gallops.  Abdomen: Bowel sounds are normal, nontender, nondistended, no hepatosplenomegaly or masses, no abdominal bruits or hernia , no rebound or guarding.   Extremities: No lower extremity edema. No clubbing or deformities. Neuro: Alert and oriented x 4   Skin: Warm and dry, no jaundice.   Psych: Alert and cooperative, normal mood and affect.    Imaging Studies: No results found.   Assessment:  80 year old female presenting to schedule 7 year surveillance colonoscopy for  history of adenomatous colon polyps.  At baseline has mild constipation.  Typically treats with high-fiber food, prunes.  Recent episode of left lower quadrant pain, treated empirically for diverticulitis.  She completed antibiotics 2 weeks ago.  Feels better.  Overall in good health.  Desires additional colonoscopy for surveillance purposes.   Plan: Colonoscopy in the near future.  Conscious sedation.  ASA III.  I have discussed the risks, alternatives, benefits with regards to but not limited to the risk of reaction to medication, bleeding, infection, perforation and the patient is agreeable to proceed. Written consent to be obtained. Continue management of mild constipation with high-fiber diet, prunes.  However 1 week prior to colonoscopy, she will take Linzess 72 mcg daily as needed.  Samples provided.

## 2021-03-14 NOTE — Patient Instructions (Signed)
Linzess 28mg samples provided today to use the week before your bowel prep. Take one every day starting 7 days before your bowel prep. You can skip a day if you had more than 3 bowel movements the day before.  Colonoscopy as scheduled. See separate instructions.

## 2021-04-04 ENCOUNTER — Other Ambulatory Visit: Payer: Self-pay | Admitting: Cardiology

## 2021-04-05 ENCOUNTER — Telehealth: Payer: Self-pay | Admitting: Internal Medicine

## 2021-04-05 ENCOUNTER — Ambulatory Visit (HOSPITAL_COMMUNITY): Admission: RE | Admit: 2021-04-05 | Payer: Medicare PPO | Source: Ambulatory Visit | Admitting: Internal Medicine

## 2021-04-05 ENCOUNTER — Encounter (HOSPITAL_COMMUNITY): Admission: RE | Payer: Self-pay | Source: Ambulatory Visit

## 2021-04-05 SURGERY — COLONOSCOPY
Anesthesia: Moderate Sedation

## 2021-04-05 NOTE — Progress Notes (Signed)
Pt scheduled for colonoscopy this AM, was to arrive at 0730.  Called pt to confirm states she made "a mistake" and thought the procedure was scheduled for Thurs.  States she has been trying to get in touch with Dr. Roseanne Kaufman office to reschedule.

## 2021-04-05 NOTE — Telephone Encounter (Signed)
Pt called this morning to say she got her days mixed up and needs to reschedule her procedure that was scheduled for today with Dr Gala Romney. [phone] or [phone]

## 2021-04-05 NOTE — Telephone Encounter (Signed)
Spoke to pt, she's aware we will call her to reschedule TCS conscious sedation when Dr. Roseanne Kaufman next schedule is available. She needs 1 box of Linzess 25mcg samples to take prior to TCS. States she already has 1 box. She stopped Iron d/t upcoming TCS but usually doesn't take it daily. Advised her she would need to hold Iron for 7 days prior to TCS. She will come by office to pick up Linzess sample.  FYI to Reba, pt was on schedule for today.

## 2021-04-17 DIAGNOSIS — G4733 Obstructive sleep apnea (adult) (pediatric): Secondary | ICD-10-CM | POA: Diagnosis not present

## 2021-04-18 NOTE — Telephone Encounter (Signed)
Called pt, TCS rescheduled to 05/17/21 at 9:30am. She picked up Linzess samples to take 1 week prior to TCS. She has prep. Orders entered. Instructions mailed.

## 2021-04-20 ENCOUNTER — Ambulatory Visit
Admission: RE | Admit: 2021-04-20 | Discharge: 2021-04-20 | Disposition: A | Discharge status: Home / Self Care | Payer: Medicare PPO | Source: Ambulatory Visit | Attending: Family Medicine | Admitting: Family Medicine

## 2021-04-20 ENCOUNTER — Other Ambulatory Visit: Payer: Self-pay

## 2021-04-20 DIAGNOSIS — Z1231 Encounter for screening mammogram for malignant neoplasm of breast: Secondary | ICD-10-CM

## 2021-04-20 DIAGNOSIS — Z23 Encounter for immunization: Secondary | ICD-10-CM | POA: Diagnosis not present

## 2021-04-21 ENCOUNTER — Other Ambulatory Visit: Payer: Self-pay

## 2021-04-21 NOTE — Telephone Encounter (Signed)
Called [place], spoke to [place], [place] for TCS updated for new date of service. [employer]# [id number], valid 05/17/21-06/16/21.

## 2021-05-02 DIAGNOSIS — H52223 Regular astigmatism, bilateral: Secondary | ICD-10-CM | POA: Diagnosis not present

## 2021-05-02 DIAGNOSIS — H26493 Other secondary cataract, bilateral: Secondary | ICD-10-CM | POA: Diagnosis not present

## 2021-05-02 DIAGNOSIS — Z961 Presence of intraocular lens: Secondary | ICD-10-CM | POA: Diagnosis not present

## 2021-05-02 DIAGNOSIS — H524 Presbyopia: Secondary | ICD-10-CM | POA: Diagnosis not present

## 2021-05-02 DIAGNOSIS — H5203 Hypermetropia, bilateral: Secondary | ICD-10-CM | POA: Diagnosis not present

## 2021-05-02 DIAGNOSIS — H401232 Low-tension glaucoma, bilateral, moderate stage: Secondary | ICD-10-CM | POA: Diagnosis not present

## 2021-05-03 ENCOUNTER — Other Ambulatory Visit (HOSPITAL_COMMUNITY): Payer: Self-pay | Admitting: Family Medicine

## 2021-05-03 DIAGNOSIS — E2839 Other primary ovarian failure: Secondary | ICD-10-CM | POA: Diagnosis not present

## 2021-05-03 DIAGNOSIS — Z23 Encounter for immunization: Secondary | ICD-10-CM | POA: Diagnosis not present

## 2021-05-03 DIAGNOSIS — Z6830 Body mass index (BMI) 30.0-30.9, adult: Secondary | ICD-10-CM | POA: Diagnosis not present

## 2021-05-03 DIAGNOSIS — E6609 Other obesity due to excess calories: Secondary | ICD-10-CM | POA: Diagnosis not present

## 2021-05-03 DIAGNOSIS — G4761 Periodic limb movement disorder: Secondary | ICD-10-CM | POA: Diagnosis not present

## 2021-05-03 DIAGNOSIS — E782 Mixed hyperlipidemia: Secondary | ICD-10-CM | POA: Diagnosis not present

## 2021-05-03 DIAGNOSIS — D709 Neutropenia, unspecified: Secondary | ICD-10-CM | POA: Diagnosis not present

## 2021-05-03 DIAGNOSIS — I10 Essential (primary) hypertension: Secondary | ICD-10-CM | POA: Diagnosis not present

## 2021-05-03 DIAGNOSIS — J4520 Mild intermittent asthma, uncomplicated: Secondary | ICD-10-CM | POA: Diagnosis not present

## 2021-05-11 ENCOUNTER — Ambulatory Visit (HOSPITAL_COMMUNITY)
Admission: RE | Admit: 2021-05-11 | Discharge: 2021-05-11 | Disposition: A | Discharge status: Home / Self Care | Payer: Medicare PPO | Source: Ambulatory Visit | Attending: Family Medicine | Admitting: Family Medicine

## 2021-05-11 ENCOUNTER — Other Ambulatory Visit: Payer: Self-pay

## 2021-05-11 DIAGNOSIS — M85832 Other specified disorders of bone density and structure, left forearm: Secondary | ICD-10-CM | POA: Diagnosis not present

## 2021-05-11 DIAGNOSIS — E2839 Other primary ovarian failure: Secondary | ICD-10-CM | POA: Diagnosis not present

## 2021-05-17 ENCOUNTER — Ambulatory Visit (HOSPITAL_COMMUNITY)
Admission: RE | Admit: 2021-05-17 | Discharge: 2021-05-17 | Disposition: A | Discharge status: Home / Self Care | Payer: Medicare PPO | Attending: Internal Medicine | Admitting: Internal Medicine

## 2021-05-17 ENCOUNTER — Encounter (HOSPITAL_COMMUNITY)
Admission: RE | Disposition: A | Discharge status: Home / Self Care | Payer: Self-pay | Source: Home / Self Care | Attending: Internal Medicine

## 2021-05-17 ENCOUNTER — Other Ambulatory Visit: Payer: Self-pay

## 2021-05-17 ENCOUNTER — Encounter (HOSPITAL_COMMUNITY): Payer: Self-pay | Admitting: Internal Medicine

## 2021-05-17 DIAGNOSIS — D126 Benign neoplasm of colon, unspecified: Secondary | ICD-10-CM | POA: Diagnosis not present

## 2021-05-17 DIAGNOSIS — D122 Benign neoplasm of ascending colon: Secondary | ICD-10-CM | POA: Diagnosis not present

## 2021-05-17 DIAGNOSIS — K635 Polyp of colon: Secondary | ICD-10-CM

## 2021-05-17 DIAGNOSIS — K5730 Diverticulosis of large intestine without perforation or abscess without bleeding: Secondary | ICD-10-CM | POA: Diagnosis not present

## 2021-05-17 DIAGNOSIS — Z86010 Personal history of colonic polyps: Secondary | ICD-10-CM | POA: Diagnosis not present

## 2021-05-17 DIAGNOSIS — Z1211 Encounter for screening for malignant neoplasm of colon: Secondary | ICD-10-CM | POA: Diagnosis not present

## 2021-05-17 HISTORY — PX: POLYPECTOMY: SHX5525

## 2021-05-17 HISTORY — PX: COLONOSCOPY: SHX5424

## 2021-05-17 SURGERY — COLONOSCOPY
Anesthesia: Moderate Sedation

## 2021-05-17 MED ORDER — MEPERIDINE HCL 50 MG/ML IJ SOLN
INTRAMUSCULAR | Status: AC
  Filled 2021-05-17: qty 1

## 2021-05-17 MED ORDER — ONDANSETRON HCL 4 MG/2ML IJ SOLN
INTRAMUSCULAR | Status: AC
  Filled 2021-05-17: qty 2

## 2021-05-17 MED ORDER — ONDANSETRON HCL 4 MG/2ML IJ SOLN
INTRAMUSCULAR | Status: DC | PRN
  Administered 2021-05-17: 4 mg via INTRAVENOUS

## 2021-05-17 MED ORDER — SODIUM CHLORIDE 0.9 % IV SOLN
INTRAVENOUS | Status: DC
  Administered 2021-05-17: 1000 mL via INTRAVENOUS

## 2021-05-17 MED ORDER — MEPERIDINE HCL 100 MG/ML IJ SOLN
INTRAMUSCULAR | Status: DC | PRN
  Administered 2021-05-17: 15 mg via INTRAVENOUS
  Administered 2021-05-17: 25 mg via INTRAVENOUS

## 2021-05-17 MED ORDER — MIDAZOLAM HCL 5 MG/5ML IJ SOLN
INTRAMUSCULAR | Status: DC | PRN
  Administered 2021-05-17 (×2): 1 mg via INTRAVENOUS

## 2021-05-17 MED ORDER — MIDAZOLAM HCL 5 MG/5ML IJ SOLN
INTRAMUSCULAR | Status: AC
  Filled 2021-05-17: qty 10

## 2021-05-17 NOTE — H&P (Signed)
@LOGO @   Primary Care Physician:  Sharilyn Sites, MD Primary Gastroenterologist:  Dr. Gala Romney  Pre-Procedure History & Physical: HPI:  Danielle [name] is a 80 y.o. female here for surveillance colonoscopy.  History of colonic adenomas.  Recent bout of presumed diverticulitis treated with antibiotics with resolution of abdominal pain. For surveillance examination.  Past Medical History:  Diagnosis Date   Anxiety    Arthritis    Asthma    seasonal; spring/fall   Diabetes mellitus without complication ([place])    Dyslipidemia    Family history of early CAD    Dobutamine Cardiolite Study 04/03/2002--Ejection Fraction on post stress images is 76%   GERD (gastroesophageal reflux disease)    Glaucoma    Hypertension    Neutropenia, cyclic (HCC)    Benign, followed by hematology   PONV (postoperative nausea and vomiting)    Sleep apnea    CPAP at night   Thrombocytopenia ([place])     Past Surgical History:  Procedure Laterality Date   COLONOSCOPY  08/09/2008     [id number] rectum, few right-sided diverticula, diminutive  polypoid mucosa at the ileocecal valve status post cold biopsy removal Remainder of colonic mucosa appeared normal   COLONOSCOPY, ESOPHAGOGASTRODUODENOSCOPY (EGD) AND ESOPHAGEAL DILATION N/A 09/29/2013   Procedure: COLONOSCOPY, ESOPHAGOGASTRODUODENOSCOPY (EGD) AND ESOPHAGEAL DILATION (ED);  Surgeon: Daneil Dolin, MD;  [facility];  Service: Endoscopy;  Laterality: N/A;  12:15   KNEE ARTHROSCOPY  07/19/2011   Procedure: ARTHROSCOPY KNEE;  Surgeon: Sanjuana Kava;  Location: [facility];  Service: Orthopedics;  Laterality: Left;  partial medial menisectomy anterior horn, removal of loose body   KNEE SURGERY     NM MYOVIEW LTD  09/2015   LOW RISK EF 66%, hyperdynamic. HTN response to exercise. NO EKG changes. No Ischemia or Infarct.     TONSILLECTOMY  1972   TRANSTHORACIC ECHOCARDIOGRAM  2003   EF normal, moderate asymmetric LVH; AV tri-leaflet, mild aortic sclerosis,  mild mitral annular calcification, mild MR, mild TR   VAGINAL HYSTERECTOMY  1985   [place], partial    Prior to Admission medications   Medication Sig Start Date End Date Taking? Authorizing Provider  aspirin EC 81 MG tablet Take 81 mg by mouth at bedtime. Swallow whole.   Yes [provider]  betamethasone dipropionate 0.05 % cream Apply 1 application topically 2 (two) times daily as needed (skin irritation.). 05/04/21  Yes [provider]  Cholecalciferol (VITAMIN D3) 50 MCG (2000 UT) TABS Take 2,000 Units by mouth in the morning.   Yes [provider]  hydrochlorothiazide (HYDRODIURIL) 25 MG tablet TAKE 1 TABLET(25 MG) BY MOUTH DAILY 04/04/21  Yes Leonie Man, MD  lansoprazole (PREVACID) 30 MG capsule Take 30 mg by mouth daily before breakfast.   Yes [provider]  Multiple Vitamin (MULTIVITAMIN WITH MINERALS) TABS tablet Take 1 tablet by mouth in the morning. Centrum Silver   Yes [provider]  Omega-3 Fatty Acids (FISH OIL OMEGA-3) 1000 MG CAPS Take 1,000 mg by mouth in the morning.   Yes [provider]  Potassium 99 MG TABS Take 99 mg by mouth daily as needed (cramping).   Yes [provider]  rosuvastatin (CRESTOR) 10 MG tablet Take 10 mg by mouth at bedtime.   Yes [provider]  albuterol (PROVENTIL HFA;VENTOLIN HFA) 108 (90 BASE) MCG/ACT inhaler Inhale 2 puffs into the lungs every 6 (six) hours as needed for wheezing. For shortness of breath    [provider]  ferrous sulfate 325 (65 FE) MG tablet Take 325 mg by mouth every 14 (fourteen) days.    [provider]  fluticasone (FLOVENT HFA) 110 MCG/ACT inhaler Inhale 1 puff into the lungs daily as needed (FOR SHORTNESS OF BREATH). For shortness of breath    [provider]  NON FORMULARY at bedtime. Uses cpap at night    [provider]    Allergies as of 04/18/2021 - Review Complete 03/14/2021  Allergen Reaction  Noted   Trilyte [peg [id number] bicarb-nacl] Swelling 09/29/2013    Family History  Problem Relation Age of Onset   Leukemia Mother    Hyperlipidemia Mother    Heart disease Father    Prostate cancer Maternal Grandfather    Kidney failure Paternal Grandmother    Gastric cancer Paternal Grandmother    Heart disease Paternal Grandfather    Diabetes Brother    Clotting disorder Brother        blood clots   Hypotension Neg Hx    Malignant hyperthermia Neg Hx    Pseudochol deficiency Neg Hx    Colon cancer Neg Hx    Breast cancer Neg Hx     Social History   Socioeconomic History   Marital status: Married    Spouse name: Not on file   Number of children: 1   Years of education: 16   Highest education level: Not on file  Occupational History   Occupation: Retired Science writer: RETIRED  Tobacco Use   Smoking status: Former    Packs/day: 0.25    Years: 8.50    Pack years: 2.13    Types: Cigarettes    Quit date: 07/15/1978    Years since quitting: 42.8   Smokeless tobacco: Never  Vaping Use   Vaping Use: Never used  Substance and Sexual Activity   Alcohol use: Not Currently    Comment: occasional   Drug use: No   Sexual activity: Not Currently    Birth control/protection: Surgical    Comment: hyst  Other Topics Concern   Not on file  Social History Narrative   Not on file   Social Determinants of Health   Financial Resource Strain: Low Risk    Difficulty of Paying Living Expenses: Not hard at all  Food Insecurity: No Food Insecurity   Worried About Charity fundraiser in the Last Year: Never true   Ran Out of Food in the Last Year: Never true  Transportation Needs: No Transportation Needs   Lack of Transportation (Medical): No   Lack of Transportation (Non-Medical): No  Physical Activity: Sufficiently Active   Days of Exercise per Week: 5 days   Minutes of Exercise per Session: 30 min  Stress: No Stress Concern Present   Feeling of Stress : Not at  all  Social Connections: Socially Integrated   Frequency of Communication with Friends and Family: More than three times a week   Frequency of Social Gatherings with Friends and Family: Once a week   Attends Religious Services: More than 4 times per year   Active Member of [employer] or Organizations: Yes   Attends Archivist Meetings: 1 to 4 times per year   Marital Status: Married  Human resources officer Violence: Not At Risk   Fear of Current or Ex-Partner: No   Emotionally Abused: No   Physically Abused: No   Sexually Abused: No    Review of Systems: See HPI, otherwise negative ROS  Physical Exam: There  were no vitals taken for this visit.  Appears younger than stated chronological age General:   Alert,  Well-developed, well-nourished, pleasant and cooperative in NAD SNeck:  Supple; no masses or thyromegaly. No significant cervical adenopathy. Lungs:  Clear throughout to auscultation.   No wheezes, crackles, or rhonchi. No acute distress. Heart:  Regular rate and rhythm; no murmurs, clicks, rubs,  or gallops. Abdomen: Non-distended, normal bowel sounds.  Soft and nontender without appreciable mass or hepatosplenomegaly.  Pulses:  Normal pulses noted. Extremities:  Without clubbing or edema.  Impression/Plan: 80 year old lady with a history of colonic adenoma here for surveillance examination.  Recent bout of abdominal pain presumed to be diverticulitis resolved with antibiotics.  I have offered the patient a surveillance colonoscopy today. The risks, benefits, limitations, alternatives and imponderables have been reviewed with the patient. Questions have been answered. All parties are agreeable.       Notice: This dictation was prepared with Dragon dictation along with smaller phrase technology. Any transcriptional errors that result from this process are unintentional and may not be corrected upon review.

## 2021-05-17 NOTE — Op Note (Signed)
[facility] Patient Name: Danielle [name] Procedure Date: 05/17/2021 8:37 AM MRN: [id number] Date of Birth: June 05, 1941 Attending MD: Norvel Richards , MD CSN: [id number] Age: 80 Admit Type: Outpatient Procedure:                Colonoscopy Indications:              High risk colon cancer surveillance: Personal                            history of colonic polyps Providers:                Norvel Richards, MD, Charlsie Quest. Theda Sers RN, RN,                            Wynonia Musty Tech, Technician Referring MD:              Medicines:                Midazolam 2 mg IV, Meperidine 40 mg IV Complications:            No immediate complications. Estimated Blood Loss:     Estimated blood loss was minimal. Procedure:                Pre-Anesthesia Assessment:                           - Prior to the procedure, a History and Physical                            was performed, and patient medications and                            allergies were reviewed. The patient's tolerance of                            previous anesthesia was also reviewed. The risks                            and benefits of the procedure and the sedation                            options and risks were discussed with the patient.                            All questions were answered, and informed consent                            was obtained. Prior Anticoagulants: The patient has                            taken no previous anticoagulant or antiplatelet                            agents. ASA Grade Assessment: III - A patient with  severe systemic disease. After reviewing the risks                            and benefits, the patient was deemed in                            satisfactory condition to undergo the procedure.                           After obtaining informed consent, the colonoscope                            was passed under direct vision. Throughout the                             procedure, the patient's blood pressure, pulse, and                            oxygen saturations were monitored continuously. The                            [id number] scope was introduced through the                            anus and advanced to the the cecum, identified by                            appendiceal orifice and ileocecal valve. The                            colonoscopy was performed without difficulty. The                            patient tolerated the procedure well. The quality                            of the bowel preparation was adequate. Scope In: 9:06:42 AM Scope Out: 9:20:59 AM Scope Withdrawal Time: 0 hours 9 minutes 48 seconds  Total Procedure Duration: 0 hours 14 minutes 17 seconds  Findings:      The perianal and digital rectal examinations were normal.      Scattered medium-mouthed diverticula were found in the sigmoid colon and       descending colon.      Three sessile polyps were found in the ascending colon. The polyps were       3 to 5 mm in size. These polyps were removed with a cold snare.       Resection and retrieval were complete. Estimated blood loss was minimal.      The exam was otherwise without abnormality on direct and retroflexion       views. Impression:               - Diverticulosis in the sigmoid colon and in the  descending colon.                           - Three 3 to 5 mm polyps in the ascending colon,                            removed with a cold snare. Resected and retrieved.                           - The examination was otherwise normal on direct                            and retroflexion views. Moderate Sedation:      Moderate (conscious) sedation was administered by the endoscopy nurse       and supervised by the endoscopist. The following parameters were       monitored: oxygen saturation, heart rate, blood pressure, respiratory       rate, EKG, adequacy of pulmonary  ventilation, and response to care.       Total physician intraservice time was 18 minutes. Recommendation:           - Patient has a contact number available for                            emergencies. The signs and symptoms of potential                            delayed complications were discussed with the                            patient. Return to normal activities tomorrow.                            Written discharge instructions were provided to the                            patient.                           - Resume previous diet.                           - Repeat colonoscopy date to be determined after                            pending pathology results are reviewed for                            surveillance.                           - Return to GI office (date not yet determined). Procedure Code(s):        --- Professional ---                           [phone], Colonoscopy, flexible; with removal of  tumor(s), polyp(s), or other lesion(s) by snare                            technique                           G0500, Moderate sedation services provided by the                            same physician or other qualified health care                            professional performing a gastrointestinal                            endoscopic service that sedation supports,                            requiring the presence of an independent trained                            observer to assist in the monitoring of the                            patient's level of consciousness and physiological                            status; initial 15 minutes of intra-service time;                            patient age 81 years or older (additional time may                            be reported with [phone], as appropriate) Diagnosis Code(s):        --- Professional ---                           Z86.010, Personal history of colonic polyps                            K63.5, Polyp of colon                           K57.30, Diverticulosis of large intestine without                            perforation or abscess without bleeding CPT copyright 2019 American Medical Association. All rights reserved. The codes documented in this report are preliminary and upon coder review may  be revised to meet current compliance requirements. Cristopher Estimable. Tomy Khim, MD Norvel Richards, MD 05/17/2021 9:32:14 AM This report has been signed electronically. Number of Addenda: 0

## 2021-05-17 NOTE — Discharge Instructions (Signed)
  Colonoscopy Discharge Instructions  Read the instructions outlined below and refer to this sheet in the next few weeks. These discharge instructions provide you with general information on caring for yourself after you leave the hospital. Your doctor may also give you specific instructions. While your treatment has been planned according to the most current medical practices available, unavoidable complications occasionally occur. If you have any problems or questions after discharge, call Dr. Gala Romney at [phone]. ACTIVITY You may resume your regular activity, but move at a slower pace for the next 24 hours.  Take frequent rest periods for the next 24 hours.  Walking will help get rid of the air and reduce the bloated feeling in your belly (abdomen).  No driving for 24 hours (because of the medicine (anesthesia) used during the test).   Do not sign any important legal documents or operate any machinery for 24 hours (because of the anesthesia used during the test).  NUTRITION Drink plenty of fluids.  You may resume your normal diet as instructed by your doctor.  Begin with a light meal and progress to your normal diet. Heavy or fried foods are harder to digest and may make you feel sick to your stomach (nauseated).  Avoid alcoholic beverages for 24 hours or as instructed.  MEDICATIONS You may resume your normal medications unless your doctor tells you otherwise.  WHAT YOU CAN EXPECT TODAY Some feelings of bloating in the abdomen.  Passage of more gas than usual.  Spotting of blood in your stool or on the toilet paper.  IF YOU HAD POLYPS REMOVED DURING THE COLONOSCOPY: No aspirin products for 7 days or as instructed.  No alcohol for 7 days or as instructed.  Eat a soft diet for the next 24 hours.  FINDING OUT THE RESULTS OF YOUR TEST Not all test results are available during your visit. If your test results are not back during the visit, make an appointment with your caregiver to find out the  results. Do not assume everything is normal if you have not heard from your caregiver or the medical facility. It is important for you to follow up on all of your test results.  SEEK IMMEDIATE MEDICAL ATTENTION IF: You have more than a spotting of blood in your stool.  Your belly is swollen (abdominal distention).  You are nauseated or vomiting.  You have a temperature over 101.  You have abdominal pain or discomfort that is severe or gets worse throughout the day.    3 small polyps removed from your colon today  Colon polyp and diverticulosis information provided  Further recommendations to follow pending review of pathology report  At patient request, I called Margurite Auerbach at [phone] -call rolled to voicemail. -Left a message.

## 2021-05-18 DIAGNOSIS — S83242A Other tear of medial meniscus, current injury, left knee, initial encounter: Secondary | ICD-10-CM | POA: Diagnosis not present

## 2021-05-18 DIAGNOSIS — M1712 Unilateral primary osteoarthritis, left knee: Secondary | ICD-10-CM | POA: Diagnosis not present

## 2021-05-18 LAB — SURGICAL PATHOLOGY

## 2021-05-19 ENCOUNTER — Encounter: Payer: Self-pay | Admitting: Internal Medicine

## 2021-05-22 ENCOUNTER — Encounter (HOSPITAL_COMMUNITY): Payer: Self-pay | Admitting: Internal Medicine

## 2021-05-25 DIAGNOSIS — D709 Neutropenia, unspecified: Secondary | ICD-10-CM | POA: Diagnosis not present

## 2021-05-25 DIAGNOSIS — J4520 Mild intermittent asthma, uncomplicated: Secondary | ICD-10-CM | POA: Diagnosis not present

## 2021-05-25 DIAGNOSIS — I10 Essential (primary) hypertension: Secondary | ICD-10-CM | POA: Diagnosis not present

## 2021-05-25 DIAGNOSIS — E7849 Other hyperlipidemia: Secondary | ICD-10-CM | POA: Diagnosis not present

## 2021-05-30 ENCOUNTER — Encounter: Payer: Self-pay | Admitting: Cardiology

## 2021-05-30 ENCOUNTER — Other Ambulatory Visit: Payer: Self-pay

## 2021-05-30 ENCOUNTER — Ambulatory Visit: Payer: Medicare PPO | Admitting: Cardiology

## 2021-05-30 VITALS — BP 130/70 | HR 65 | Ht 65.0 in | Wt 178.4 lb

## 2021-05-30 DIAGNOSIS — R002 Palpitations: Secondary | ICD-10-CM

## 2021-05-30 DIAGNOSIS — E785 Hyperlipidemia, unspecified: Secondary | ICD-10-CM

## 2021-05-30 DIAGNOSIS — I10 Essential (primary) hypertension: Secondary | ICD-10-CM | POA: Diagnosis not present

## 2021-05-30 NOTE — Patient Instructions (Signed)

## 2021-05-30 NOTE — Progress Notes (Signed)
Primary Care Provider: Sharilyn Sites, MD Cardiologist: Glenetta Hew, MD Electrophysiologist: None  Clinic Note: Chief Complaint  Patient presents with   Follow-up    Annual.  No issues.  PCP recently started statin   Palpitations    Well-controlled   Hypertension    Well-controlled.     ===================================  ASSESSMENT/PLAN   Problem List Items Addressed This Visit       Cardiology Problems   Essential hypertension - Primary (Chronic)    Blood pressure looks well controlled on HCTZ.  Not on any other medications.  Could consider beta-blocker with having palpitations, but we are trying to avoid AV nodal agents because of issues with fatigue and bradycardia in the past.      Relevant Orders   EKG 12-Lead (Completed)   Hyperlipidemia with target LDL less than 100 (Chronic)    LDL now down to 81.  Converted from simvastatin to rosuvastatin.  We will follow-up recent labs.      Relevant Orders   EKG 12-Lead (Completed)     Other   Intermittent palpitations (Chronic)    Palpitations associated with stress and anxiety.  CT berry controlled now that her life is stabilized out now.  Because of issues with fatigue and intermittent bradycardia, we have been avoiding AV nodal agents.      Relevant Orders   EKG 12-Lead (Completed)    ===================================  HPI:    Danielle [name] is a 80 y.o. female with a PMH notable for HTN, HLD (: Plaque noted in evaluation), Family History of CAD, and Intermittent Palpitations who presents today for annual follow-up.  Danielle [name] was last seen on May 03, 2020: Doing well.  Palpitations controlled.  No major issues.  She was somewhat exhausted because her husband has been in and out of the hospital with small bowel obstruction complicated by A. fib.  Going back and forth in the hospital to centimeters was quite exhausting.  She had put on some weight, because she has not been intentional she was  eating.  With fatigue she noted more palpitations.  Elevated exertional dyspnea going up flights of steps, but was becoming quite deconditioned.  She had gotten back to working out on her exercise bike.    Recent Hospitalizations:  05/17/2021-colonoscopy.  Reviewed  CV studies:    The following studies were reviewed today: (if available, images/films reviewed: From Epic Chart or Care Everywhere) None:  Interval History:   Danielle [name] returns for annual follow-up overall stating that she is doing a whole lot better.  She no longer notes getting short of breath going up and down steps, and is doing otherwise doing well.  Recently converted to Crestor by PCP.  Blood pressures have been up and down.  But usually in the 130/70-1 140/78 mmHg range.  As long she stays adequately hydrated and avoids triggers like caffeine and being only 52, she is not really noticing personally of any palpitations.   CV Review of Symptoms (Summary) Cardiovascular ROS: positive for - -notably improved exertional dyspnea, and controlled palpitations. negative for - edema, irregular heartbeat, orthopnea, palpitations, paroxysmal nocturnal dyspnea, rapid heart rate, shortness of breath, or lightheadedness, dizziness or wooziness, syncope/near syncope or TIA/amaurosis fugax, claudication  REVIEWED OF SYSTEMS   Review of Systems  Constitutional:  Negative for malaise/fatigue and weight loss.  HENT:  Negative for congestion.   Respiratory:  Negative for cough and shortness of breath.   Gastrointestinal:  Negative for blood in stool, constipation  and melena.  Genitourinary:  Negative for hematuria.  Musculoskeletal:  Positive for joint pain (Mild aches and pains).  Neurological:  Negative for dizziness and focal weakness.  Psychiatric/Behavioral:  Negative for depression and memory loss. The patient is not nervous/anxious and does not have insomnia.    I have reviewed and (if needed) personally updated the  patient's problem list, medications, allergies, past medical and surgical history, social and family history.   PAST MEDICAL HISTORY   Past Medical History:  Diagnosis Date   Anxiety    Arthritis    Asthma    seasonal; spring/fall   Diabetes mellitus without complication ([place])    Dyslipidemia    Family history of early CAD    Dobutamine Cardiolite Study 04/03/2002--Ejection Fraction on post stress images is 76%   GERD (gastroesophageal reflux disease)    Glaucoma    Hypertension    Neutropenia, cyclic (HCC)    Benign, followed by hematology   PONV (postoperative nausea and vomiting)    Sleep apnea    CPAP at night   Thrombocytopenia ([place])     PAST SURGICAL HISTORY   Past Surgical History:  Procedure Laterality Date   COLONOSCOPY  08/09/2008     UXN:ATFTDD rectum, few right-sided diverticula, diminutive  polypoid mucosa at the ileocecal valve status post cold biopsy removal Remainder of colonic mucosa appeared normal   COLONOSCOPY N/A 05/17/2021   Procedure: COLONOSCOPY;  Surgeon: Daneil Dolin, MD;  [facility];  Service: Endoscopy;  Laterality: N/A;  9:30am   COLONOSCOPY, ESOPHAGOGASTRODUODENOSCOPY (EGD) AND ESOPHAGEAL DILATION N/A 09/29/2013   Procedure: COLONOSCOPY, ESOPHAGOGASTRODUODENOSCOPY (EGD) AND ESOPHAGEAL DILATION (ED);  Surgeon: Daneil Dolin, MD;  [facility];  Service: Endoscopy;  Laterality: N/A;  12:15   KNEE ARTHROSCOPY  07/19/2011   Procedure: ARTHROSCOPY KNEE;  Surgeon: Sanjuana Kava;  Location: [facility];  Service: Orthopedics;  Laterality: Left;  partial medial menisectomy anterior horn, removal of loose body   KNEE SURGERY     NM MYOVIEW LTD  09/2015   LOW RISK EF 66%, hyperdynamic. HTN response to exercise. NO EKG changes. No Ischemia or Infarct.     POLYPECTOMY  05/17/2021   Procedure: POLYPECTOMY;  Surgeon: Daneil Dolin, MD;  [facility];  Service: Endoscopy;;   TONSILLECTOMY  1972   TRANSTHORACIC ECHOCARDIOGRAM   2003   EF normal, moderate asymmetric LVH; AV tri-leaflet, mild aortic sclerosis, mild mitral annular calcification, mild MR, mild TR   VAGINAL HYSTERECTOMY  1985   [place], partial    Immunization History  Administered Date(s) Administered   Moderna Sars-Covid-2 Vaccination 07/28/2019, 08/29/2019    MEDICATIONS/ALLERGIES   Current Meds  Medication Sig   albuterol (PROVENTIL HFA;VENTOLIN HFA) 108 (90 BASE) MCG/ACT inhaler Inhale 2 puffs into the lungs every 6 (six) hours as needed for wheezing. For shortness of breath   aspirin EC 81 MG tablet Take 81 mg by mouth at bedtime. Swallow whole.   betamethasone dipropionate 0.05 % cream Apply 1 application topically 2 (two) times daily as needed (skin irritation.).   Cholecalciferol (VITAMIN D3) 50 MCG (2000 UT) TABS Take 2,000 Units by mouth in the morning.   ferrous sulfate 325 (65 FE) MG tablet Take 325 mg by mouth every 14 (fourteen) days.   fluticasone (FLOVENT HFA) 110 MCG/ACT inhaler Inhale 1 puff into the lungs daily as needed (FOR SHORTNESS OF BREATH). For shortness of breath   hydrochlorothiazide (HYDRODIURIL) 25 MG tablet TAKE 1 TABLET(25 MG) BY MOUTH DAILY  lansoprazole (PREVACID) 30 MG capsule Take 30 mg by mouth daily before breakfast.   Multiple Vitamin (MULTIVITAMIN WITH MINERALS) TABS tablet Take 1 tablet by mouth in the morning. Centrum Silver   NON FORMULARY at bedtime. Uses cpap at night   Omega-3 Fatty Acids (FISH OIL OMEGA-3) 1000 MG CAPS Take 1,000 mg by mouth in the morning.   Potassium 99 MG TABS Take 99 mg by mouth daily as needed (cramping).   rosuvastatin (CRESTOR) 10 MG tablet Take 10 mg by mouth at bedtime.    Allergies  Allergen Reactions   Trilyte [Peg 3350-Kcl-Na Bicarb-Nacl] Swelling    Swelling of tongue    SOCIAL HISTORY/FAMILY HISTORY   Reviewed in Epic:  Pertinent findings:  Social History   Tobacco Use   Smoking status: Former    Packs/day: 0.25    Years: 8.50    Pack years: 2.13     Types: Cigarettes    Quit date: 07/15/1978    Years since quitting: 42.9   Smokeless tobacco: Never  Vaping Use   Vaping Use: Never used  Substance Use Topics   Alcohol use: Not Currently    Comment: occasional   Drug use: No   Social History   Social History Narrative   Not on file    OBJCTIVE -PE, EKG, labs   Wt Readings from Last 3 Encounters:  05/30/21 178 lb 6.4 oz (80.9 kg)  05/17/21 175 lb (79.4 kg)  03/14/21 177 lb 3.2 oz (80.4 kg)    Physical Exam:  BP 130/70 (BP Location: Left Arm)   Pulse 65   Ht 5\' 5"  (1.651 m)   Wt 178 lb 6.4 oz (80.9 kg)   SpO2 99%   BMI 29.69 kg/m  Physical Exam Vitals reviewed.  Constitutional:      General: She is not in acute distress.    Appearance: Normal appearance. She is normal weight. She is not ill-appearing or toxic-appearing.  HENT:     Head: Normocephalic and atraumatic.  Neck:     Vascular: No carotid bruit or JVD.  Cardiovascular:     Rate and Rhythm: Normal rate and regular rhythm. No extrasystoles are present.    Chest Wall: PMI is not displaced.     Pulses: No decreased pulses.     Heart sounds: S1 normal and S2 normal. No murmur heard.   No friction rub. No gallop.  Pulmonary:     Effort: Pulmonary effort is normal. No respiratory distress.     Breath sounds: Normal breath sounds.  Chest:     Chest wall: No tenderness.  Musculoskeletal:        General: No swelling. Normal range of motion.     Cervical back: Normal range of motion and neck supple.  Skin:    General: Skin is warm and dry.  Neurological:     General: No focal deficit present.     Mental Status: She is alert and oriented to person, place, and time.  Psychiatric:        Mood and Affect: Mood normal.        Behavior: Behavior normal.        Thought Content: Thought content normal.        Judgment: Judgment normal.     Adult ECG Report  Rate: 65;  Rhythm: normal sinus rhythm; LAFB.  Borderline LVH.  Cannot exclude anterior Mikan  age-indeterminate.  Narrative Interpretation: Stable  Recent Labs: Apparently labs were just checked by PCP, but most recent labs  available are from Nov 28, 2020: 11/28/2020: TC 161, TG 55, HDL 59, LDL 81.   No results found for: HGBA1C   ==================================================  COVID-19 Education: The signs and symptoms of COVID-19 were discussed with the patient and how to seek care for testing (follow up with PCP or arrange E-visit).    I spent a total of 22 minutes with the patient spent in direct patient consultation.  Additional time spent with chart review  / charting (studies, outside notes, etc): 14 min Total Time: 36 min  Current medicines are reviewed at length with the patient today.  (+/- concerns) none  This visit occurred during the SARS-CoV-2 public health emergency.  Safety protocols were in place, including screening questions prior to the visit, additional usage of staff PPE, and extensive cleaning of exam room while observing appropriate contact time as indicated for disinfecting solutions.  Notice: This dictation was prepared with Dragon dictation along with smart phrase technology. Any transcriptional errors that result from this process are unintentional and may not be corrected upon review.  Studies Ordered:   Orders Placed This Encounter  Procedures   EKG 12-Lead     Patient Instructions / Medication Changes & Studies & Tests Ordered   Patient Instructions  Medication Instructions:   No changes *If you need a refill on your cardiac medications before your next appointment, please call your pharmacy*   Lab Work: Not needed   Testing/Procedures:  Not needed   Follow-Up: At [facility], you and your health needs are our priority.  As part of our continuing mission to provide you with exceptional heart care, we have created designated Provider Care Teams.  These Care Teams include your primary Cardiologist (physician) and Advanced  Practice Providers (APPs -  Physician Assistants and Nurse Practitioners) who all work together to provide you with the care you need, when you need it.  We recommend signing up for the patient portal called "MyChart".  Sign up information is provided on this After Visit Summary.  MyChart is used to connect with patients for Virtual Visits (Telemedicine).  Patients are able to view lab/test results, encounter notes, upcoming appointments, etc.  Non-urgent messages can be sent to your provider as well.   To learn more about what you can do with MyChart, go to [email or web address].    Your next appointment:   12 month(s)  The format for your next appointment:   In Person  Provider:   Glenetta Hew, MD         Glenetta Hew, M.D., M.S. Interventional Cardiologist   Pager # [phone] Phone # [phone] [address]. [place], [place] [address]   Thank you for choosing [facility] at [facility]!!

## 2021-06-05 DIAGNOSIS — N393 Stress incontinence (female) (male): Secondary | ICD-10-CM | POA: Diagnosis not present

## 2021-06-05 DIAGNOSIS — R350 Frequency of micturition: Secondary | ICD-10-CM | POA: Diagnosis not present

## 2021-06-14 DIAGNOSIS — E6609 Other obesity due to excess calories: Secondary | ICD-10-CM | POA: Diagnosis not present

## 2021-06-14 DIAGNOSIS — G4761 Periodic limb movement disorder: Secondary | ICD-10-CM | POA: Diagnosis not present

## 2021-06-14 DIAGNOSIS — K5732 Diverticulitis of large intestine without perforation or abscess without bleeding: Secondary | ICD-10-CM | POA: Diagnosis not present

## 2021-06-14 DIAGNOSIS — E2839 Other primary ovarian failure: Secondary | ICD-10-CM | POA: Diagnosis not present

## 2021-06-14 DIAGNOSIS — K219 Gastro-esophageal reflux disease without esophagitis: Secondary | ICD-10-CM | POA: Diagnosis not present

## 2021-06-14 DIAGNOSIS — D709 Neutropenia, unspecified: Secondary | ICD-10-CM | POA: Diagnosis not present

## 2021-06-14 DIAGNOSIS — E782 Mixed hyperlipidemia: Secondary | ICD-10-CM | POA: Diagnosis not present

## 2021-06-14 DIAGNOSIS — E663 Overweight: Secondary | ICD-10-CM | POA: Diagnosis not present

## 2021-06-14 DIAGNOSIS — I10 Essential (primary) hypertension: Secondary | ICD-10-CM | POA: Diagnosis not present

## 2021-06-24 ENCOUNTER — Encounter: Payer: Self-pay | Admitting: Cardiology

## 2021-06-24 NOTE — Assessment & Plan Note (Signed)
Palpitations associated with stress and anxiety.  CT berry controlled now that her life is stabilized out now.  Because of issues with fatigue and intermittent bradycardia, we have been avoiding AV nodal agents.

## 2021-06-24 NOTE — Assessment & Plan Note (Addendum)
Blood pressure looks well controlled on HCTZ.  Not on any other medications.  Could consider beta-blocker with having palpitations, but we are trying to avoid AV nodal agents because of issues with fatigue and bradycardia in the past.

## 2021-06-24 NOTE — Assessment & Plan Note (Signed)
LDL now down to 81.  Converted from simvastatin to rosuvastatin.  We will follow-up recent labs.

## 2021-08-11 DIAGNOSIS — H401232 Low-tension glaucoma, bilateral, moderate stage: Secondary | ICD-10-CM | POA: Diagnosis not present

## 2021-08-11 DIAGNOSIS — H26493 Other secondary cataract, bilateral: Secondary | ICD-10-CM | POA: Diagnosis not present

## 2021-08-11 DIAGNOSIS — H52223 Regular astigmatism, bilateral: Secondary | ICD-10-CM | POA: Diagnosis not present

## 2021-08-11 DIAGNOSIS — Z961 Presence of intraocular lens: Secondary | ICD-10-CM | POA: Diagnosis not present

## 2021-08-11 DIAGNOSIS — H5203 Hypermetropia, bilateral: Secondary | ICD-10-CM | POA: Diagnosis not present

## 2021-08-11 DIAGNOSIS — H524 Presbyopia: Secondary | ICD-10-CM | POA: Diagnosis not present

## 2021-08-30 DIAGNOSIS — H5203 Hypermetropia, bilateral: Secondary | ICD-10-CM | POA: Diagnosis not present

## 2021-08-30 DIAGNOSIS — Z961 Presence of intraocular lens: Secondary | ICD-10-CM | POA: Diagnosis not present

## 2021-08-30 DIAGNOSIS — H26493 Other secondary cataract, bilateral: Secondary | ICD-10-CM | POA: Diagnosis not present

## 2021-08-30 DIAGNOSIS — H401232 Low-tension glaucoma, bilateral, moderate stage: Secondary | ICD-10-CM | POA: Diagnosis not present

## 2021-08-30 DIAGNOSIS — H52223 Regular astigmatism, bilateral: Secondary | ICD-10-CM | POA: Diagnosis not present

## 2021-08-30 DIAGNOSIS — H524 Presbyopia: Secondary | ICD-10-CM | POA: Diagnosis not present

## 2021-09-22 ENCOUNTER — Other Ambulatory Visit: Payer: Self-pay | Admitting: Cardiology

## 2021-09-29 DIAGNOSIS — H26492 Other secondary cataract, left eye: Secondary | ICD-10-CM | POA: Diagnosis not present

## 2021-09-29 DIAGNOSIS — H401233 Low-tension glaucoma, bilateral, severe stage: Secondary | ICD-10-CM | POA: Diagnosis not present

## 2021-09-29 DIAGNOSIS — H26491 Other secondary cataract, right eye: Secondary | ICD-10-CM | POA: Diagnosis not present

## 2021-11-30 DIAGNOSIS — H401232 Low-tension glaucoma, bilateral, moderate stage: Secondary | ICD-10-CM | POA: Diagnosis not present

## 2021-11-30 DIAGNOSIS — Z961 Presence of intraocular lens: Secondary | ICD-10-CM | POA: Diagnosis not present

## 2021-11-30 DIAGNOSIS — H26493 Other secondary cataract, bilateral: Secondary | ICD-10-CM | POA: Diagnosis not present

## 2021-11-30 DIAGNOSIS — H524 Presbyopia: Secondary | ICD-10-CM | POA: Diagnosis not present

## 2021-11-30 DIAGNOSIS — H52223 Regular astigmatism, bilateral: Secondary | ICD-10-CM | POA: Diagnosis not present

## 2021-11-30 DIAGNOSIS — H5203 Hypermetropia, bilateral: Secondary | ICD-10-CM | POA: Diagnosis not present

## 2021-12-05 DIAGNOSIS — D709 Neutropenia, unspecified: Secondary | ICD-10-CM | POA: Diagnosis not present

## 2021-12-05 DIAGNOSIS — R946 Abnormal results of thyroid function studies: Secondary | ICD-10-CM | POA: Diagnosis not present

## 2021-12-05 DIAGNOSIS — R7309 Other abnormal glucose: Secondary | ICD-10-CM | POA: Diagnosis not present

## 2021-12-05 DIAGNOSIS — E782 Mixed hyperlipidemia: Secondary | ICD-10-CM | POA: Diagnosis not present

## 2021-12-05 DIAGNOSIS — E7849 Other hyperlipidemia: Secondary | ICD-10-CM | POA: Diagnosis not present

## 2021-12-05 DIAGNOSIS — R102 Pelvic and perineal pain: Secondary | ICD-10-CM | POA: Diagnosis not present

## 2021-12-05 DIAGNOSIS — I10 Essential (primary) hypertension: Secondary | ICD-10-CM | POA: Diagnosis not present

## 2021-12-05 DIAGNOSIS — Z6829 Body mass index (BMI) 29.0-29.9, adult: Secondary | ICD-10-CM | POA: Diagnosis not present

## 2021-12-05 DIAGNOSIS — G4761 Periodic limb movement disorder: Secondary | ICD-10-CM | POA: Diagnosis not present

## 2021-12-11 DIAGNOSIS — D224 Melanocytic nevi of scalp and neck: Secondary | ICD-10-CM | POA: Diagnosis not present

## 2021-12-11 DIAGNOSIS — L820 Inflamed seborrheic keratosis: Secondary | ICD-10-CM | POA: Diagnosis not present

## 2021-12-19 DIAGNOSIS — H401233 Low-tension glaucoma, bilateral, severe stage: Secondary | ICD-10-CM | POA: Diagnosis not present

## 2022-01-03 DIAGNOSIS — H52223 Regular astigmatism, bilateral: Secondary | ICD-10-CM | POA: Diagnosis not present

## 2022-01-03 DIAGNOSIS — H401232 Low-tension glaucoma, bilateral, moderate stage: Secondary | ICD-10-CM | POA: Diagnosis not present

## 2022-01-03 DIAGNOSIS — H5203 Hypermetropia, bilateral: Secondary | ICD-10-CM | POA: Diagnosis not present

## 2022-01-03 DIAGNOSIS — H524 Presbyopia: Secondary | ICD-10-CM | POA: Diagnosis not present

## 2022-01-03 DIAGNOSIS — Z961 Presence of intraocular lens: Secondary | ICD-10-CM | POA: Diagnosis not present

## 2022-03-21 DIAGNOSIS — M549 Dorsalgia, unspecified: Secondary | ICD-10-CM | POA: Diagnosis not present

## 2022-03-21 DIAGNOSIS — Z1331 Encounter for screening for depression: Secondary | ICD-10-CM | POA: Diagnosis not present

## 2022-03-21 DIAGNOSIS — I10 Essential (primary) hypertension: Secondary | ICD-10-CM | POA: Diagnosis not present

## 2022-03-21 DIAGNOSIS — Z0001 Encounter for general adult medical examination with abnormal findings: Secondary | ICD-10-CM | POA: Diagnosis not present

## 2022-03-21 DIAGNOSIS — G4761 Periodic limb movement disorder: Secondary | ICD-10-CM | POA: Diagnosis not present

## 2022-03-21 DIAGNOSIS — D709 Neutropenia, unspecified: Secondary | ICD-10-CM | POA: Diagnosis not present

## 2022-03-21 DIAGNOSIS — Z6829 Body mass index (BMI) 29.0-29.9, adult: Secondary | ICD-10-CM | POA: Diagnosis not present

## 2022-03-21 DIAGNOSIS — J4520 Mild intermittent asthma, uncomplicated: Secondary | ICD-10-CM | POA: Diagnosis not present

## 2022-03-21 DIAGNOSIS — E782 Mixed hyperlipidemia: Secondary | ICD-10-CM | POA: Diagnosis not present

## 2022-03-29 ENCOUNTER — Other Ambulatory Visit: Payer: Self-pay | Admitting: Family Medicine

## 2022-03-29 DIAGNOSIS — Z1231 Encounter for screening mammogram for malignant neoplasm of breast: Secondary | ICD-10-CM

## 2022-04-24 DIAGNOSIS — H401232 Low-tension glaucoma, bilateral, moderate stage: Secondary | ICD-10-CM | POA: Diagnosis not present

## 2022-04-24 DIAGNOSIS — H524 Presbyopia: Secondary | ICD-10-CM | POA: Diagnosis not present

## 2022-04-24 DIAGNOSIS — Z961 Presence of intraocular lens: Secondary | ICD-10-CM | POA: Diagnosis not present

## 2022-04-24 DIAGNOSIS — H5203 Hypermetropia, bilateral: Secondary | ICD-10-CM | POA: Diagnosis not present

## 2022-04-24 DIAGNOSIS — H52223 Regular astigmatism, bilateral: Secondary | ICD-10-CM | POA: Diagnosis not present

## 2022-05-01 ENCOUNTER — Ambulatory Visit
Admission: RE | Admit: 2022-05-01 | Discharge: 2022-05-01 | Disposition: A | Discharge status: Home / Self Care | Payer: Medicare PPO | Source: Ambulatory Visit | Attending: Family Medicine | Admitting: Family Medicine

## 2022-05-01 DIAGNOSIS — Z1231 Encounter for screening mammogram for malignant neoplasm of breast: Secondary | ICD-10-CM

## 2022-05-11 ENCOUNTER — Ambulatory Visit: Payer: Medicare PPO | Attending: Cardiology | Admitting: Cardiology

## 2022-05-11 ENCOUNTER — Encounter: Payer: Self-pay | Admitting: Cardiology

## 2022-05-11 VITALS — BP 137/74 | HR 64 | Ht 66.0 in | Wt 174.6 lb

## 2022-05-11 DIAGNOSIS — R002 Palpitations: Secondary | ICD-10-CM | POA: Diagnosis not present

## 2022-05-11 DIAGNOSIS — E785 Hyperlipidemia, unspecified: Secondary | ICD-10-CM | POA: Diagnosis not present

## 2022-05-11 DIAGNOSIS — R1319 Other dysphagia: Secondary | ICD-10-CM

## 2022-05-11 DIAGNOSIS — I10 Essential (primary) hypertension: Secondary | ICD-10-CM

## 2022-05-11 DIAGNOSIS — R072 Precordial pain: Secondary | ICD-10-CM | POA: Diagnosis not present

## 2022-05-11 LAB — BASIC METABOLIC PANEL
BUN/Creatinine Ratio: 18 (ref 12–28)
BUN: 13 mg/dL (ref 8–27)
CO2: 28 mmol/L (ref 20–29)
Calcium: 9.3 mg/dL (ref 8.7–10.3)
Chloride: 101 mmol/L (ref 96–106)
Creatinine, Ser: 0.71 mg/dL (ref 0.57–1.00)
Glucose: 116 mg/dL — ABNORMAL HIGH (ref 70–99)
Potassium: 3.9 mmol/L (ref 3.5–5.2)
Sodium: 140 mmol/L (ref 134–144)
eGFR: 86 mL/min/{1.73_m2} (ref >59)

## 2022-05-11 MED ORDER — METOPROLOL TARTRATE 50 MG PO TABS: 50.0000 mg | ORAL_TABLET | Freq: Once | ORAL | 0 refills | Status: DC

## 2022-05-11 NOTE — Progress Notes (Unsigned)
., f no past any  Primary Care Provider: Sharilyn Sites, [place] Cardiologist: Glenetta Hew, MD Electrophysiologist: None  Clinic Note: No chief complaint on file.   =================================== is any  ASSESSMENT/PLAN   Problem List Items Addressed This Visit       Cardiology Problems   Essential hypertension - Primary (Chronic)   Hyperlipidemia with target LDL less than 100 (Chronic)     Other   Precordial pain   Intermittent palpitations (Chronic)    ===================================  HPI:    Danielle [name] is a 81 y.o. female with a PMH notable for HTN, HLD (coronary plaque), family history of CAD and intermittent palpitations who presents today for annual follow-up. She continues to follow-up at the request of Sharilyn Sites, MD.  Danielle [name] was last seen in November 2022.  BPs were borderline in the 130-140 / 70s range.  PCP had started her on Crestor.  Staying adequately hydrated.  Try and avoid triggers.  Recent Hospitalizations: ***  Reviewed  CV studies:    The following studies were reviewed today: (if available, images/films reviewed: From Epic Chart or Care Everywhere) ***:  Interval History:   [name]   CV Review of Symptoms (Summary): Cardiovascular ROS: {roscv:310661} Cardiovascular ROS: positive for - -notably improved exertional dyspnea, and controlled palpitations. negative for - edema, irregular heartbeat, orthopnea, palpitations, paroxysmal nocturnal dyspnea, rapid heart rate, shortness of breath, or lightheadedness, dizziness or wooziness, syncope/near syncope or TIA/amaurosis fugax, claudication  REVIEWED OF SYSTEMS   ROS Mild joint pains  I have reviewed and (if needed) personally updated the patient's problem list, medications, allergies, past medical and surgical history, social and family history.   PAST MEDICAL HISTORY   Past Medical History:  Diagnosis Date   Anxiety    Arthritis     Asthma    seasonal; spring/fall   Diabetes mellitus without complication ([place])    Dyslipidemia    Family history of early CAD    Dobutamine Cardiolite Study 04/03/2002--Ejection Fraction on post stress images is 76%   GERD (gastroesophageal reflux disease)    Glaucoma    Hypertension    Neutropenia, cyclic (HCC)    Benign, followed by hematology   PONV (postoperative nausea and vomiting)    Sleep apnea    CPAP at night   Thrombocytopenia ([place])     PAST SURGICAL HISTORY   Past Surgical History:  Procedure Laterality Date   COLONOSCOPY  08/09/2008     ZDG:LOVFIE rectum, few right-sided diverticula, diminutive  polypoid mucosa at the ileocecal valve status post cold biopsy removal Remainder of colonic mucosa appeared normal   COLONOSCOPY N/A 05/17/2021   Procedure: COLONOSCOPY;  Surgeon: Daneil Dolin, MD;  [facility];  Service: Endoscopy;  Laterality: N/A;  9:30am   COLONOSCOPY, ESOPHAGOGASTRODUODENOSCOPY (EGD) AND ESOPHAGEAL DILATION N/A 09/29/2013   Procedure: COLONOSCOPY, ESOPHAGOGASTRODUODENOSCOPY (EGD) AND ESOPHAGEAL DILATION (ED);  Surgeon: Daneil Dolin, MD;  [facility];  Service: Endoscopy;  Laterality: N/A;  12:15   KNEE ARTHROSCOPY  07/19/2011   Procedure: ARTHROSCOPY KNEE;  Surgeon: Sanjuana Kava;  [facility]: [facility];  Service: Orthopedics;  Laterality: Left;  partial medial menisectomy anterior horn, removal of loose body   KNEE SURGERY     NM MYOVIEW LTD  09/2015   LOW RISK EF 66%, hyperdynamic. HTN response to exercise. NO EKG changes. No Ischemia or Infarct.     POLYPECTOMY  05/17/2021   Procedure: POLYPECTOMY;  Surgeon: Daneil Dolin,  MD;  [facility];  Service: Endoscopy;;   TONSILLECTOMY  1972   TRANSTHORACIC ECHOCARDIOGRAM  2003   EF normal, moderate asymmetric LVH; AV tri-leaflet, mild aortic sclerosis, mild mitral annular calcification, mild MR, mild TR   VAGINAL HYSTERECTOMY  1985   [place], partial    Immunization  History  Administered Date(s) Administered   Moderna Sars-Covid-2 Vaccination 07/28/2019, 08/29/2019    MEDICATIONS/ALLERGIES   Current Meds  Medication Sig   albuterol (PROVENTIL HFA;VENTOLIN HFA) 108 (90 BASE) MCG/ACT inhaler Inhale 2 puffs into the lungs every 6 (six) hours as needed for wheezing. For shortness of breath   aspirin EC 81 MG tablet Take 81 mg by mouth at bedtime. Swallow whole.   betamethasone dipropionate 0.05 % cream Apply 1 application topically 2 (two) times daily as needed (skin irritation.).   Cholecalciferol (VITAMIN D3) 50 MCG (2000 UT) TABS Take 2,000 Units by mouth in the morning.   ferrous sulfate 325 (65 FE) MG tablet Take 325 mg by mouth every 14 (fourteen) days.   fluticasone (FLOVENT HFA) 110 MCG/ACT inhaler Inhale 1 puff into the lungs daily as needed (FOR SHORTNESS OF BREATH). For shortness of breath   hydrochlorothiazide (HYDRODIURIL) 25 MG tablet TAKE 1 TABLET(25 MG) BY MOUTH DAILY   lansoprazole (PREVACID) 30 MG capsule Take 30 mg by mouth daily before breakfast.   Multiple Vitamin (MULTIVITAMIN WITH MINERALS) TABS tablet Take 1 tablet by mouth in the morning. Centrum Silver   NON FORMULARY at bedtime. Uses cpap at night   Omega-3 Fatty Acids (FISH OIL OMEGA-3) 1000 MG CAPS Take 1,000 mg by mouth in the morning.   Potassium 99 MG TABS Take 99 mg by mouth daily as needed (cramping).   rosuvastatin (CRESTOR) 10 MG tablet Take 10 mg by mouth at bedtime.    Allergies  Allergen Reactions   Trilyte [Peg 3350-Kcl-Na Bicarb-Nacl] Swelling    Swelling of tongue    SOCIAL HISTORY/FAMILY HISTORY   Reviewed in Epic:  Pertinent findings:  Social History   Tobacco Use   Smoking status: Former    Packs/day: 0.25    Years: 8.50    Total pack years: 2.13    Types: Cigarettes    Quit date: 07/15/1978    Years since quitting: 43.8   Smokeless tobacco: Never  Vaping Use   Vaping Use: Never used  Substance Use Topics   Alcohol use: Not Currently     Comment: occasional   Drug use: No   Social History   Social History Narrative   Not on file    OBJCTIVE -PE, EKG, labs   Wt Readings from Last 3 Encounters:  05/11/22 174 lb 9.6 oz (79.2 kg)  05/30/21 178 lb 6.4 oz (80.9 kg)  05/17/21 175 lb (79.4 kg)    Physical Exam: BP 137/74   Pulse 64   Ht '5\' 6"'$  (1.676 m)   Wt 174 lb 9.6 oz (79.2 kg)   SpO2 100%   BMI 28.18 kg/m  Physical Exam  Normal   Adult ECG Report  Rate: *** ;  Rhythm: {rhythm:17366};   Narrative Interpretation: ***  Recent Labs:  ***  No results found for: "CHOL", "HDL", "LDLCALC", "LDLDIRECT", "TRIG", "CHOLHDL" Lab Results  Component Value Date   CREATININE 0.65 07/01/2017   BUN 15 07/01/2017   NA 141 07/01/2017   K 3.5 07/01/2017   CL 106 07/01/2017   CO2 24 07/01/2017      Latest Ref Rng & Units 07/01/2017  6:50 AM 09/01/2015    8:18 PM 10/07/2013    9:32 AM  CBC  WBC 4.0 - 10.5 K/uL 5.7  5.3  3.1   Hemoglobin 12.0 - 15.0 g/dL 11.9  13.5  11.4   Hematocrit 36.0 - 46.0 % 36.1  40.6  34.3   Platelets 150 - 400 K/uL 237  264  234     No results found for: "HGBA1C" No results found for: "TSH"  ================================================== I spent a total of ***minutes with the patient spent in direct patient consultation.  Additional time spent with chart review  / charting (studies, outside notes, etc): *** min Total Time: *** min  Current medicines are reviewed at length with the patient today.  (+/- concerns) ***  Notice: This dictation was prepared with Dragon dictation along with smart phrase technology. Any transcriptional errors that result from this process are unintentional and may not be corrected upon review.  Studies Ordered:   No orders of the defined types were placed in this encounter.  No orders of the defined types were placed in this encounter.   Patient Instructions / Medication Changes & Studies & Tests Ordered   There are no Patient Instructions on file  for this visit.     Leonie Man, MD, MS Glenetta Hew, M.D., M.S. Interventional Cardiologist  [place]  Pager # [phone] Phone # [phone] [address] [place],  [address]   Thank you for choosing [facility] [place]!!

## 2022-05-11 NOTE — Patient Instructions (Addendum)
Medication Instructions:   See below instructions _ metoprolol tartrate 50 mg one time dose .  *If you need a refill on your cardiac medications before your next appointment, please call your pharmacy*   Lab Work: BMP If you have labs (blood work) drawn today and your tests are completely normal, you will receive your results only by: [place] (if you have MyChart) OR A paper copy in the mail If you have any lab test that is abnormal or we need to change your treatment, we will call you to review the results.   Testing/Procedures: Will be schedule at [facility] - Radiology dept  - go to entrance C  on [place] has requested that you have coronary CTA. Coronary computed tomography (CT) angiogram  is a painless test that uses an x-ray machine to take clear, detailed pictures of your heart arteries. Please follow instruction sheet as given.    Follow-Up: At [facility], you and your health needs are our priority.  As part of our continuing mission to provide you with exceptional heart care, we have created designated Provider Care Teams.  These Care Teams include your primary Cardiologist (physician) and Advanced Practice Providers (APPs -  Physician Assistants and Nurse Practitioners) who all work together to provide you with the care you need, when you need it.     Your next appointment:   2 month(s)  The format for your next appointment:   In Person  Provider:   Glenetta Hew, MD    Other Instructions    Your cardiac CT will be scheduled at the below location:   [facility] [address] [place], [place] [address] [phone]    Please arrive at the [facility] and Children's Entrance (Entrance C2) of [facility] 30 minutes prior to test start time. You can use the FREE valet parking offered at entrance C (encouraged to control the heart rate for the test)  Proceed to the [facility] Radiology  Department (first floor) to check-in and test prep.  All radiology patients and guests should use entrance C2 at [facility], accessed from [facility], even though the hospital's physical address listed is [address].       Please follow these instructions carefully (unless otherwise directed):  Please have lab done  at least 7 days prior to test    On the Night Before the Test: Be sure to Drink plenty of water. Do not consume any caffeinated/decaffeinated beverages or chocolate 12 hours prior to your test. Do not take any antihistamines 12 hours prior to your test.   On the Day of the Test: Drink plenty of water until 1 hour prior to the test. Do not eat any food 1 hour prior to test. You may take your regular medications prior to the test.  Take metoprolol (Lopressor) 50 mg  two hours prior to test. HOLD Hydrochlorothiazide  25 mg morning of the test. FEMALES- please wear underwire-free bra if available, avoid dresses & tight clothing   *      After the Test: Drink plenty of water. After receiving IV contrast, you may experience a mild flushed feeling. This is normal. On occasion, you may experience a mild rash up to 24 hours after the test. This is not dangerous. If this occurs, you can take Benadryl 25 mg and increase your fluid intake. If you experience trouble breathing, this can be serious. If it is  severe call 911 IMMEDIATELY. If it is mild, please call our office. If you take any of these medications: Glipizide/Metformin, Avandament, Glucavance, please do not take 48 hours after completing test unless otherwise instructed.  We will call to schedule your test 2-4 weeks out understanding that some insurance companies will need an authorization prior to the service being performed.   For non-scheduling related questions, please contact the cardiac imaging nurse navigator should you have any questions/concerns: Marchia Bond, Cardiac  Imaging Nurse Navigator Gordy Clement, Cardiac Imaging Nurse Navigator [facility] Direct Office Dial: [phone]   For scheduling needs, including cancellations and rescheduling, please call [place], [phone].

## 2022-05-12 ENCOUNTER — Encounter: Payer: Self-pay | Admitting: Cardiology

## 2022-05-12 NOTE — Assessment & Plan Note (Signed)
I suspect that some of her chest discomfort is probably related to this, but need to exclude ischemic CAD based on the fact that she is now having it more frequently.

## 2022-05-12 NOTE — Assessment & Plan Note (Signed)
Stable BP on current meds.  Hold off on changes for now.

## 2022-05-12 NOTE — Assessment & Plan Note (Signed)
Seem to be pretty well controlled.  Not on beta-blocker.

## 2022-05-12 NOTE — Assessment & Plan Note (Signed)
May need to adjust based on results of Coronary CTA, currently on 10 mg rosuvastatin with an LDL of 74.  Pretty well at goal, unless Coronary Calcium Score and Coronary CTA are suggestive of significant disease.

## 2022-05-12 NOTE — Assessment & Plan Note (Signed)
Somewhat atypical sounding symptoms, but she does have risk factors but is now noted chest pain that may be related to exertional dyspnea. Plan Coronary CTA.  We will check chemistry panel

## 2022-05-14 ENCOUNTER — Telehealth: Payer: Self-pay | Admitting: *Deleted

## 2022-05-16 NOTE — Telephone Encounter (Signed)
Open error 

## 2022-05-28 ENCOUNTER — Telehealth (HOSPITAL_COMMUNITY): Payer: Self-pay | Admitting: *Deleted

## 2022-05-28 NOTE — Telephone Encounter (Signed)
Attempted to call patient regarding upcoming cardiac CT appointment. °Left message on voicemail with name and callback number ° °Chantz Montefusco RN Navigator Cardiac Imaging °[facility] °[phone] Office °[phone] Cell ° °

## 2022-05-29 ENCOUNTER — Ambulatory Visit (HOSPITAL_COMMUNITY): Admission: RE | Admit: 2022-05-29 | Payer: Medicare PPO | Source: Ambulatory Visit

## 2022-06-04 DIAGNOSIS — N952 Postmenopausal atrophic vaginitis: Secondary | ICD-10-CM | POA: Diagnosis not present

## 2022-06-04 DIAGNOSIS — N393 Stress incontinence (female) (male): Secondary | ICD-10-CM | POA: Diagnosis not present

## 2022-06-04 DIAGNOSIS — Z87440 Personal history of urinary (tract) infections: Secondary | ICD-10-CM | POA: Diagnosis not present

## 2022-06-12 ENCOUNTER — Telehealth (HOSPITAL_COMMUNITY): Payer: Self-pay | Admitting: Emergency Medicine

## 2022-06-12 NOTE — Telephone Encounter (Signed)
Reaching out to patient to offer assistance regarding upcoming cardiac imaging study; pt verbalizes understanding of appt date/time, parking situation and where to check in, pre-test NPO status and medications ordered, and verified current allergies; name and call back number provided for further questions should they arise Danielle Bond RN Navigator Cardiac Imaging [facility] [phone] office [phone] cell  Arrival 1030 '50mg'$  metoprolol aware contrast/nitro Denies iv issues

## 2022-06-13 ENCOUNTER — Ambulatory Visit (HOSPITAL_COMMUNITY)
Admission: RE | Admit: 2022-06-13 | Discharge: 2022-06-13 | Disposition: A | Discharge status: Home / Self Care | Payer: Medicare PPO | Source: Ambulatory Visit | Attending: Cardiology | Admitting: Cardiology

## 2022-06-13 ENCOUNTER — Encounter (HOSPITAL_COMMUNITY): Payer: Self-pay

## 2022-06-13 DIAGNOSIS — R072 Precordial pain: Secondary | ICD-10-CM | POA: Diagnosis not present

## 2022-06-13 MED ORDER — NITROGLYCERIN 0.4 MG SL SUBL
SUBLINGUAL_TABLET | SUBLINGUAL | Status: AC
  Filled 2022-06-13: qty 2

## 2022-06-13 MED ORDER — IOHEXOL 350 MG/ML SOLN
100.0000 mL | Freq: Once | INTRAVENOUS | Status: AC | PRN
  Administered 2022-06-13: 100 mL via INTRAVENOUS

## 2022-06-13 MED ORDER — NITROGLYCERIN 0.4 MG SL SUBL
0.8000 mg | SUBLINGUAL_TABLET | Freq: Once | SUBLINGUAL | Status: AC
  Administered 2022-06-13: 0.8 mg via SUBLINGUAL

## 2022-06-19 ENCOUNTER — Other Ambulatory Visit: Payer: Self-pay | Admitting: Cardiology

## 2022-07-30 DIAGNOSIS — G4733 Obstructive sleep apnea (adult) (pediatric): Secondary | ICD-10-CM | POA: Diagnosis not present

## 2022-07-31 ENCOUNTER — Encounter: Payer: Self-pay | Admitting: Cardiology

## 2022-07-31 ENCOUNTER — Ambulatory Visit: Payer: Medicare PPO | Attending: Cardiology | Admitting: Cardiology

## 2022-07-31 VITALS — BP 118/70 | HR 72 | Ht 67.0 in | Wt 172.0 lb

## 2022-07-31 DIAGNOSIS — E785 Hyperlipidemia, unspecified: Secondary | ICD-10-CM

## 2022-07-31 DIAGNOSIS — I10 Essential (primary) hypertension: Secondary | ICD-10-CM

## 2022-07-31 DIAGNOSIS — R072 Precordial pain: Secondary | ICD-10-CM | POA: Diagnosis not present

## 2022-07-31 DIAGNOSIS — R002 Palpitations: Secondary | ICD-10-CM | POA: Diagnosis not present

## 2022-07-31 NOTE — Progress Notes (Signed)
Primary Care Provider: Sharilyn [name], [place] Cardiologist: Danielle Hew, MD Electrophysiologist: None  Clinic Note: Chief Complaint  Patient presents with   Follow-up    To discuss test [name] - doing well; no further CP    ===================================   ASSESSMENT/PLAN   Problem List Items Addressed This Visit       Cardiology Problems   Hyperlipidemia with target LDL less than 100 (Chronic)    Last LDL was 74.  Given the fact that he Coronary Calcium Score so low with no significant CAD, I think were well within target.  Continue rosuvastatin 10 mg daily.      Essential hypertension - Primary (Chronic)    Well-controlled blood pressure.  She is on HCTZ alone and tolerating well.        Other   Precordial pain    She is no longer having any further chest discomfort episodes.  She said that ever since she got back from her trip, in fact before she went to urgent well and was very excited to hear the [name] of the Coronary CTA.  Very reassured and ready to get back to her regular exercise routine.      Intermittent palpitations (Chronic)    Well-controlled palpitations.  She thinks it may be related to the fatigue and stress.  Doing better now.      ===================================  HPI:    Danielle [name] is a 82 y.o. female with a PMH notable for HTN, HLD (coronary plaque), family history of CAD and intermittent palpitations who presents today for 52-monthfollow-up to reassess chest tightness. She continues to follow-up at the request of GSharilyn Sites MD.  GDarliss RidgelMDAVENA JULIANwas last seen on November [name], Danielle [name]: She was noticing off-and-on episodes of chest tightness that are worse over the last several months.  May or may not occur with exertion.  Does not necessarily increase with exertion.  Not associate with exertional dyspnea.  Trying to stay active, but was fearful of being active with ongoing chest discomfort.  She had also  gotten out of shape because her husband was in the hospital for bowel obstruction.  She felt to be somewhat deconditioned. Coronary CTA ordered  Recent Hospitalizations:  None  Reviewed  CV studies:    The following studies were reviewed today: (if available, images/films reviewed: From Epic Chart or Care Everywhere) Coronary CTA 12/6/Danielle [name]: No notable noncardiac findings.  Coronary Calcium Score is 24-43rd percentile.  Minimal proximal calcified plaque in the LAD (0-24%).  Very low risk, reassuring finding.  Interval History:   Danielle [name].  She is doing remarkably well.  No further episodes of chest pain or pressure.  She just returned with her [place] from [place] 123-month[place].  They are very excited.  She is very excited to hear the [name] of her test.  She is otherwise doing very well Danielle [name] standpoint.  Palpitations are well-controlled.  No more chest pain.  Still has some exertional dyspnea but is hoping to get back into walking now that she is ready [name] of her test. Otherwise CV ROS normal as noted below.  CV Review of Symptoms (Summary): Cardiovascular ROS: positive for - dyspnea on exertion and exercise intolerance-likely related to deconditioning. negative for - chest pain, edema, irregular heartbeat, orthopnea, palpitations, paroxysmal nocturnal dyspnea, rapid heart rate, shortness of breath, or syncope or near syncope, TIA/amaurosis fugax or  claudication.  REVIEWED OF SYSTEMS   Review of Systems  Constitutional:  Negative for malaise/fatigue and weight loss.  HENT:  Negative for congestion.   Respiratory:  Negative for cough, sputum production and shortness of breath (Less DOE).   Cardiovascular:        Per HPI  Gastrointestinal:  Negative for abdominal pain, blood in stool and melena.  Genitourinary:  Negative for hematuria.  Musculoskeletal:  Positive for joint pain  (Mild off-and-on MSK pain.).  Psychiatric/Behavioral:  Negative for substance abuse and suicidal ideas. The patient is nervous/anxious.     I have reviewed and (if needed) personally updated the patient's problem list, medications, allergies, past medical and surgical history, social and family history.   PAST MEDICAL HISTORY   Past Medical History:  Diagnosis Date   Anxiety    Arthritis    Asthma    seasonal; spring/fall   Diabetes mellitus without complication ([place])    Dyslipidemia    Family history of early CAD    Dobutamine Cardiolite Study 04/03/2002--Ejection Fraction on post stress images is 76%   GERD (gastroesophageal reflux disease)    Glaucoma    Hypertension    Neutropenia, cyclic (HCC)    Benign, followed by hematology   PONV (postoperative nausea and vomiting)    Sleep apnea    CPAP at night   Thrombocytopenia ([place])     PAST SURGICAL HISTORY   Past Surgical History:  Procedure Laterality Date   COLONOSCOPY  08/09/2008     PRF:FMBWGY rectum, few right-sided diverticula, diminutive  polypoid mucosa at the ileocecal valve status post cold biopsy removal Remainder of colonic mucosa appeared normal   COLONOSCOPY N/A 05/17/2021   Procedure: COLONOSCOPY;  Surgeon: Danielle Dolin, MD;  Location: [facility];  Service: Endoscopy;  Laterality: N/A;  9:30am   COLONOSCOPY, ESOPHAGOGASTRODUODENOSCOPY (EGD) AND ESOPHAGEAL DILATION N/A [name]/24/2015   Procedure: COLONOSCOPY, ESOPHAGOGASTRODUODENOSCOPY (EGD) AND ESOPHAGEAL DILATION (ED);  Surgeon: Danielle Dolin, MD;  Location: [facility];  Service: Endoscopy;  Laterality: N/A;  12:15   KNEE ARTHROSCOPY  07/19/2011   Procedure: ARTHROSCOPY KNEE;  Surgeon: Danielle [name];  Location: [facility] ORS;  Service: Orthopedics;  Laterality: Left;  partial medial menisectomy anterior horn, removal of loose body   KNEE SURGERY     NM MYOVIEW LTD  09/2015   LOW RISK EF 66%, hyperdynamic. HTN response to exercise. NO EKG changes. No Ischemia or  Infarct.     POLYPECTOMY  05/17/2021   Procedure: POLYPECTOMY;  Surgeon: Danielle Dolin, MD;  Location: [facility];  Service: Endoscopy;;   TONSILLECTOMY  1972   TRANSTHORACIC ECHOCARDIOGRAM  2003   EF normal, moderate asymmetric LVH; AV tri-leaflet, mild aortic sclerosis, mild mitral annular calcification, mild MR, mild TR   VAGINAL HYSTERECTOMY  1985   [place], partial    Immunization History  Administered Date(s) Administered   Moderna Sars-Covid-2 Vaccination 07/28/2019, 08/29/2019    MEDICATIONS/ALLERGIES   Current Meds  Medication Sig   albuterol (PROVENTIL HFA;VENTOLIN HFA) 108 (90 BASE) MCG/ACT inhaler Inhale 2 puffs into the lungs every 6 (six) hours as needed for wheezing. For shortness of breath   aspirin EC 81 MG tablet Take 81 mg by mouth at bedtime. Swallow whole.   betamethasone dipropionate 0.05 % cream Apply 1 application topically 2 (two) times daily as needed (skin irritation.).   Cholecalciferol (VITAMIN D3) 50 MCG (2000 UT) TABS Take 2,000 Units by mouth in the morning.   ferrous sulfate 325 (65 FE) MG  tablet Take 325 mg by mouth every 14 (fourteen) days.   fluticasone (FLOVENT HFA) 110 MCG/ACT inhaler Inhale 1 puff into the lungs daily as needed (FOR SHORTNESS OF BREATH). For shortness of breath   hydrochlorothiazide (HYDRODIURIL) 25 MG tablet TAKE 1 TABLET(25 MG) BY MOUTH DAILY   lansoprazole (PREVACID) 30 MG capsule Take 30 mg by mouth daily before breakfast.   Multiple Vitamin (MULTIVITAMIN WITH MINERALS) TABS tablet Take 1 tablet by mouth in the morning. Centrum Silver   NON FORMULARY at bedtime. Uses cpap at night   Omega-[name] Fatty Acids (FISH OIL OMEGA-[name]) 1000 MG CAPS Take 1,000 mg by mouth in the morning.   Potassium 99 MG TABS Take 99 mg by mouth daily as needed (cramping).   rosuvastatin (CRESTOR) 10 MG tablet Take 10 mg by mouth at bedtime.    Allergies  Allergen Reactions   Trilyte [Peg 3350-Kcl-Na Bicarb-Nacl] Swelling    Swelling of tongue     SOCIAL HISTORY/FAMILY HISTORY   Reviewed in Epic:  Pertinent findings:  Social History   Tobacco Use   Smoking status: Former    Packs/day: 0.25    Years: 8.50    Total pack years: 2.13    Types: Cigarettes    Quit date: 07/15/1978    Years since quitting: 44.0   Smokeless tobacco: Never  Vaping Use   Vaping Use: Never used  Substance Use Topics   Alcohol use: Not Currently    Comment: occasional   Drug use: No   Social History   Social History Narrative   Not on file    OBJCTIVE -PE, EKG, labs   Wt Readings from Last [name] Encounters:  07/31/22 172 lb (78 kg)  05/11/22 174 lb 9.6 oz (79.2 kg)  05/30/21 178 lb 6.4 oz (80.9 kg)    Physical Exam: BP 118/70   Pulse 72   Ht '5\' 7"'$  (1.702 m)   Wt 172 lb (78 kg)   SpO2 98%   BMI 26.94 kg/m  Physical Exam Vitals reviewed.  Constitutional:      General: She is not in acute distress.    Appearance: Normal appearance. She is normal weight. She is not ill-appearing or toxic-appearing.  HENT:     Head: Normocephalic and atraumatic.  Musculoskeletal:        General: No swelling (Trivial). Normal range of motion.     Cervical back: Normal range of motion and neck supple.  Skin:    General: Skin is warm and dry.  Neurological:     General: No focal deficit present.     Mental Status: She is alert and oriented to person, place, and time.     Gait: Gait normal.  Psychiatric:        Mood and Affect: Mood normal.        Behavior: Behavior normal.        Thought Content: Thought content normal.        Judgment: Judgment normal.     Adult ECG Report N/A  Recent Labs:   5/31/Danielle [name]: TC 159, TG 63, HDL 70, LDL 74.  Hgb 12.1, Cr 0.07, K+ [name].8, ALT 21, TSH 1.81.  PLT 254. 9/13/Danielle [name]: A1c 6.0 No [name] found for: "CHOL", "HDL", "LDLCALC", "LDLDIRECT", "TRIG", "CHOLHDL" Lab [name]  Component Value Date   CREATININE 0.71 11/03/Danielle [name]   BUN 13 11/03/Danielle [name]   NA 140 11/03/Danielle [name]   K [name].9 11/03/Danielle [name]   CL 101 11/03/Danielle [name]   CO2  28 11/03/Danielle [name]      Latest  Ref Rng & Units 07/01/2017    6:50 AM 09/01/2015    8:18 PM 10/07/2013    9:32 AM  CBC  WBC 4.0 - 10.5 K/uL 5.7  5.[name]  [name].1   Hemoglobin 12.0 - 15.0 Danielle/dL 11.9  13.5  11.4   Hematocrit 36.0 - 46.0 % 36.1  40.6  34.[name]   Platelets 150 - 400 K/uL 237  264  234     No [name] found for: "HGBA1C" No [name] found for: "TSH"  ================================================== I spent a total of 23 minutes with the patient spent in direct patient consultation.  Additional time spent with chart review  / charting (studies, outside notes, etc): 14 min Total Time: 37 min  Current medicines are reviewed at length with the patient today.  (+/- concerns) none  Notice: This dictation was prepared with Dragon dictation along with smart phrase technology. Any transcriptional errors that result from this process are unintentional and may not be corrected upon review.  Studies Ordered:   No orders of the defined types were placed in this encounter.  No orders of the defined types were placed in this encounter.   Patient Instructions / Medication Changes & Studies & Tests Ordered   Patient Instructions  Medication Instructions:   No changes  *If you need a refill on your cardiac medications before your next appointment, please call your pharmacy*   Lab Work:  Not needed     Testing/Procedures:  Not needed  Follow-Up: At [facility], you and your health needs are our priority.  As part of our continuing mission to provide you with exceptional heart care, we have created designated Provider Care Teams.  These Care Teams include your primary Cardiologist (physician) and Advanced Practice Providers (APPs -  Physician Assistants and Nurse Practitioners) who all work together to provide you with the care you need, when you need it.  We recommend signing up for the patient portal called "MyChart".  Sign up information is provided on this After Visit Summary.   MyChart is used to connect with patients for Virtual Visits (Telemedicine).  Patients are able to view lab/test [name], encounter notes, upcoming appointments, etc.  Non-urgent messages can be sent to your provider as well.   To learn more about what you can do with MyChart, go to [email or web address].    Your next appointment:   12 month(s)  The format for your next appointment:   In Person  Provider:   Glenetta Hew, MD         Leonie Man, MD, MS Danielle [name], M.D., M.S. Interventional Cardiologist  [place]  Pager # [phone] Phone # [phone] [address] [place], [place] [address]   Thank you for choosing [facility]!!

## 2022-07-31 NOTE — Assessment & Plan Note (Addendum)
Last LDL was 74.  Given the fact that he Coronary Calcium Score so low with no significant CAD, I think were well within target.  Continue rosuvastatin 10 mg daily.

## 2022-07-31 NOTE — Assessment & Plan Note (Signed)
Well-controlled palpitations.  She thinks it may be related to the fatigue and stress.  Doing better now.

## 2022-07-31 NOTE — Assessment & Plan Note (Signed)
She is no longer having any further chest discomfort episodes.  She said that ever since she got back from her trip, in fact before she went to urgent well and was very excited to hear the results of the Coronary CTA.  Very reassured and ready to get back to her regular exercise routine.

## 2022-07-31 NOTE — Patient Instructions (Signed)
Medication Instructions:   No changes  *If you need a refill on your cardiac medications before your next appointment, please call your pharmacy*   Lab Work:  Not needed     Testing/Procedures:  Not needed  Follow-Up: At [facility], you and your health needs are our priority.  As part of our continuing mission to provide you with exceptional heart care, we have created designated Provider Care Teams.  These Care Teams include your primary Cardiologist (physician) and Advanced Practice Providers (APPs -  Physician Assistants and Nurse Practitioners) who all work together to provide you with the care you need, when you need it.  We recommend signing up for the patient portal called "MyChart".  Sign up information is provided on this After Visit Summary.  MyChart is used to connect with patients for Virtual Visits (Telemedicine).  Patients are able to view lab/test results, encounter notes, upcoming appointments, etc.  Non-urgent messages can be sent to your provider as well.   To learn more about what you can do with MyChart, go to [email or web address].    Your next appointment:   12 month(s)  The format for your next appointment:   In Person  Provider:   Glenetta Hew, MD

## 2022-07-31 NOTE — Assessment & Plan Note (Signed)
Well-controlled blood pressure.  She is on HCTZ alone and tolerating well.

## 2022-09-18 DIAGNOSIS — G4761 Periodic limb movement disorder: Secondary | ICD-10-CM | POA: Diagnosis not present

## 2022-09-18 DIAGNOSIS — E1129 Type 2 diabetes mellitus with other diabetic kidney complication: Secondary | ICD-10-CM | POA: Diagnosis not present

## 2022-09-18 DIAGNOSIS — E7849 Other hyperlipidemia: Secondary | ICD-10-CM | POA: Diagnosis not present

## 2022-09-18 DIAGNOSIS — Z6829 Body mass index (BMI) 29.0-29.9, adult: Secondary | ICD-10-CM | POA: Diagnosis not present

## 2022-09-18 DIAGNOSIS — D709 Neutropenia, unspecified: Secondary | ICD-10-CM | POA: Diagnosis not present

## 2022-09-18 DIAGNOSIS — R946 Abnormal results of thyroid function studies: Secondary | ICD-10-CM | POA: Diagnosis not present

## 2022-09-18 DIAGNOSIS — I10 Essential (primary) hypertension: Secondary | ICD-10-CM | POA: Diagnosis not present

## 2022-09-18 DIAGNOSIS — E663 Overweight: Secondary | ICD-10-CM | POA: Diagnosis not present

## 2022-09-18 DIAGNOSIS — R102 Pelvic and perineal pain: Secondary | ICD-10-CM | POA: Diagnosis not present

## 2022-09-18 DIAGNOSIS — E782 Mixed hyperlipidemia: Secondary | ICD-10-CM | POA: Diagnosis not present

## 2022-11-05 DIAGNOSIS — G4733 Obstructive sleep apnea (adult) (pediatric): Secondary | ICD-10-CM | POA: Diagnosis not present

## 2022-11-13 IMAGING — MG MM DIGITAL SCREENING BILAT W/ TOMO AND CAD
8 series · 9 of 24 positions shown · non-contrast
Comparison: Previous exam(s).

CLINICAL DATA: Screening.

EXAM:
DIGITAL SCREENING BILATERAL MAMMOGRAM WITH TOMOSYNTHESIS AND CAD
TECHNIQUE: Bilateral screening digital craniocaudal and mediolateral oblique
mammograms were obtained. Bilateral screening digital breast
tomosynthesis was performed. The images were evaluated with
computer-aided detection.

[L CC synth-2D]
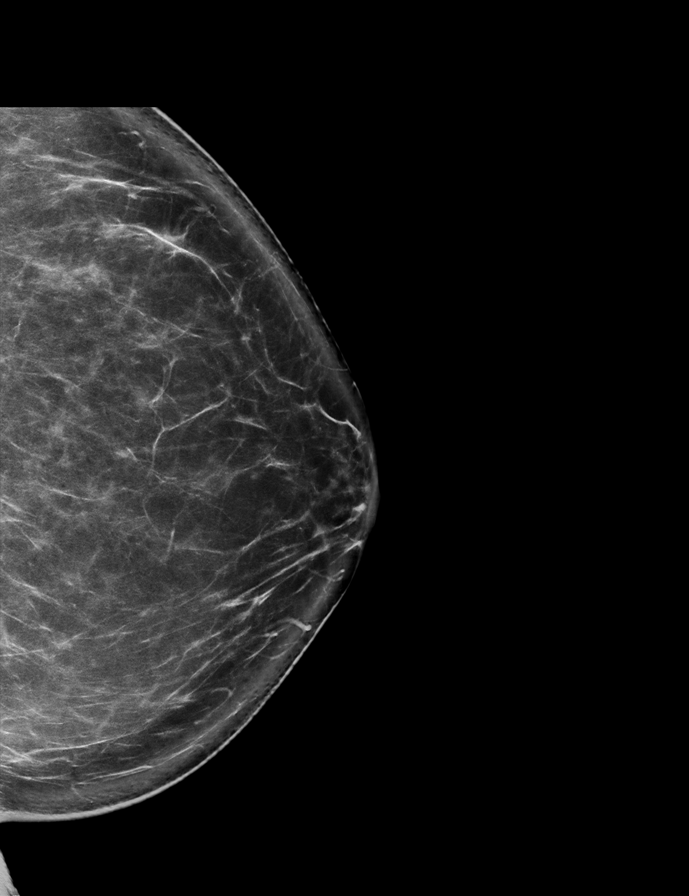

[L MLO synth-2D]
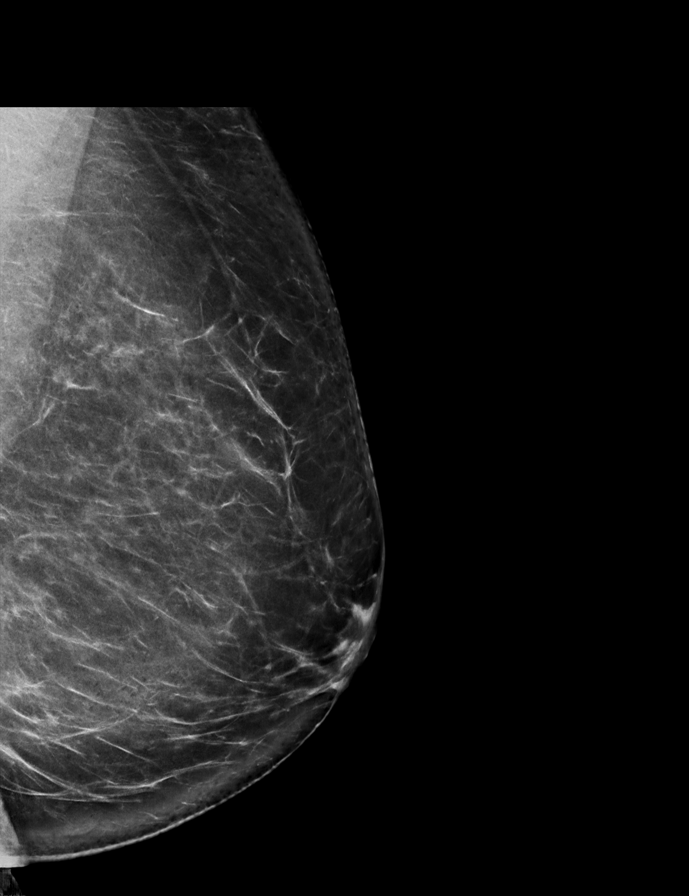

[R MLO synth-2D]
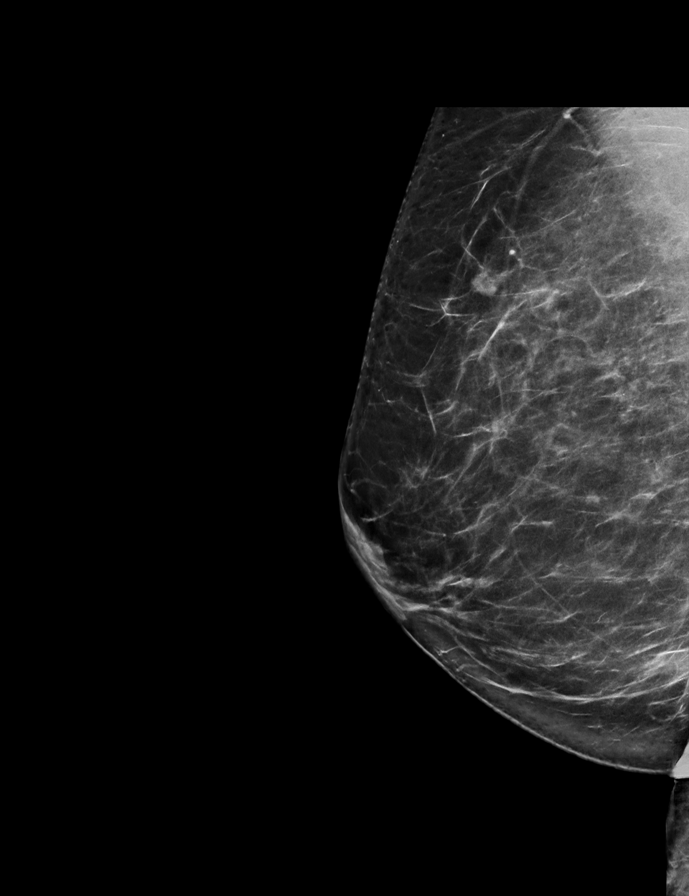

[R CC synth-2D]
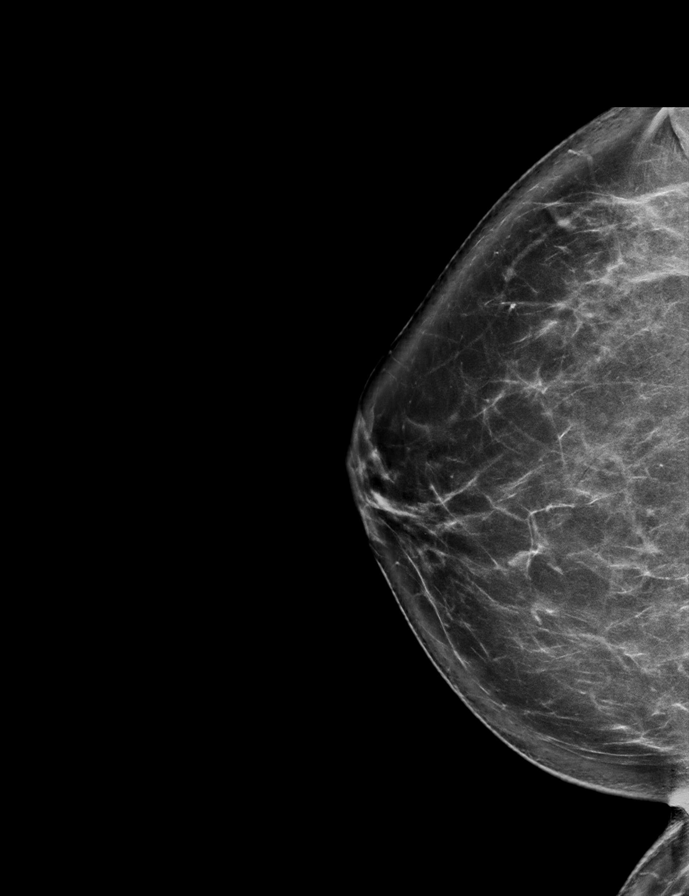

[L MLO tomo · 2 of 87 frames shown]
[frame 29/87]
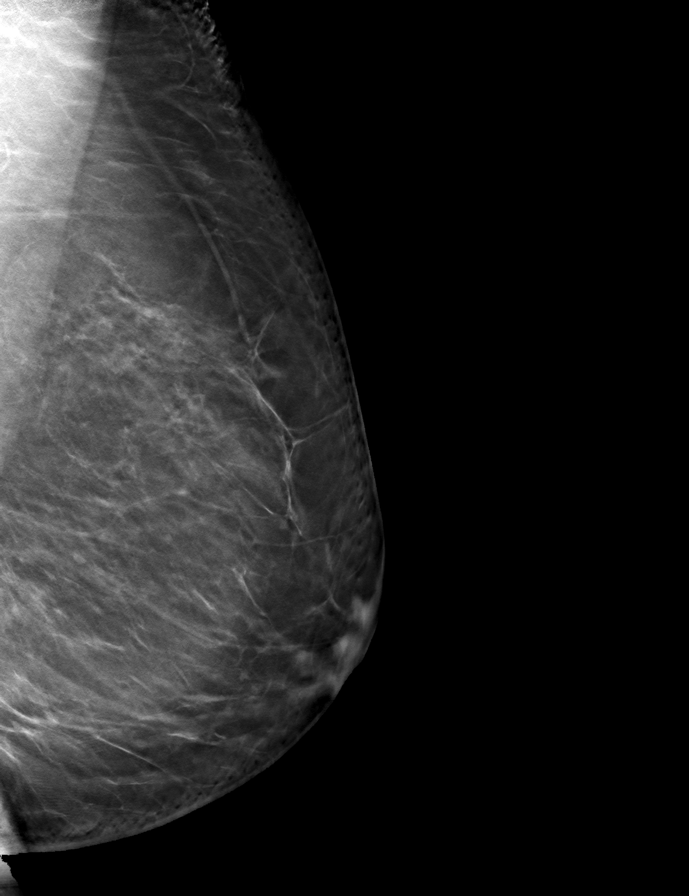
[frame 44/87]
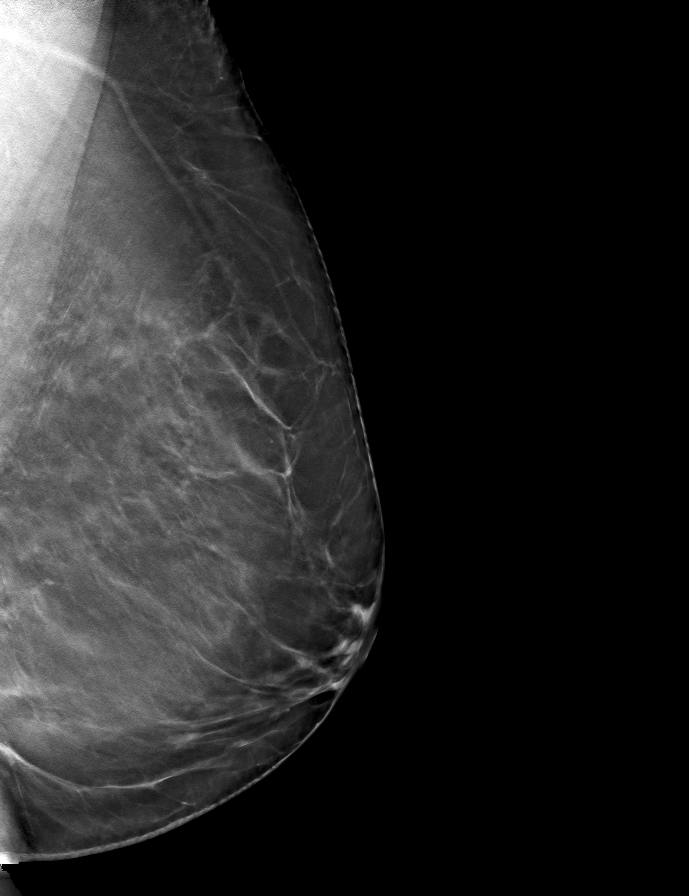

[L CC tomo · tomo slice 43/85.0]
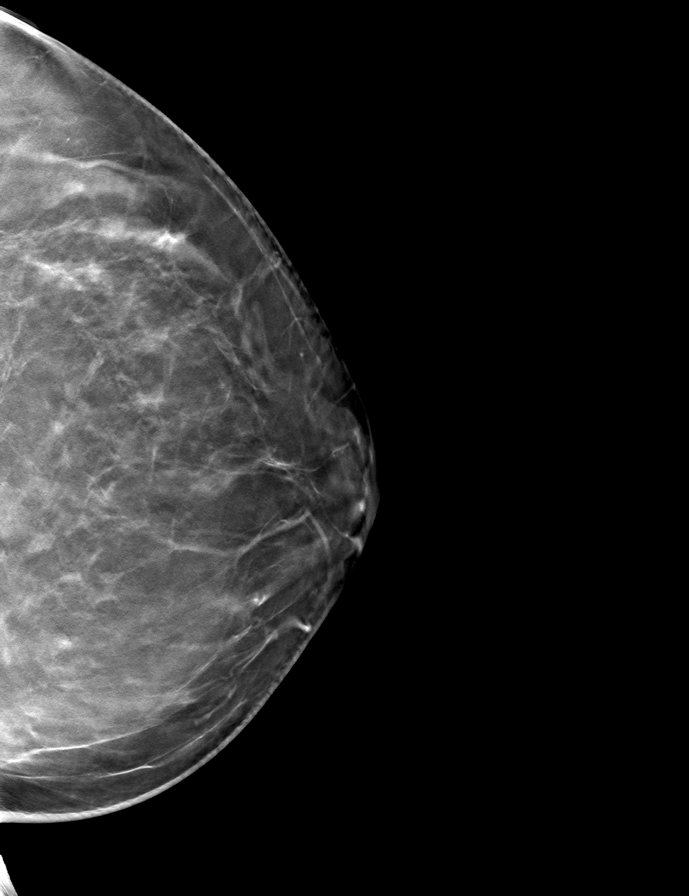

[R CC tomo · tomo slice 41/82.0]
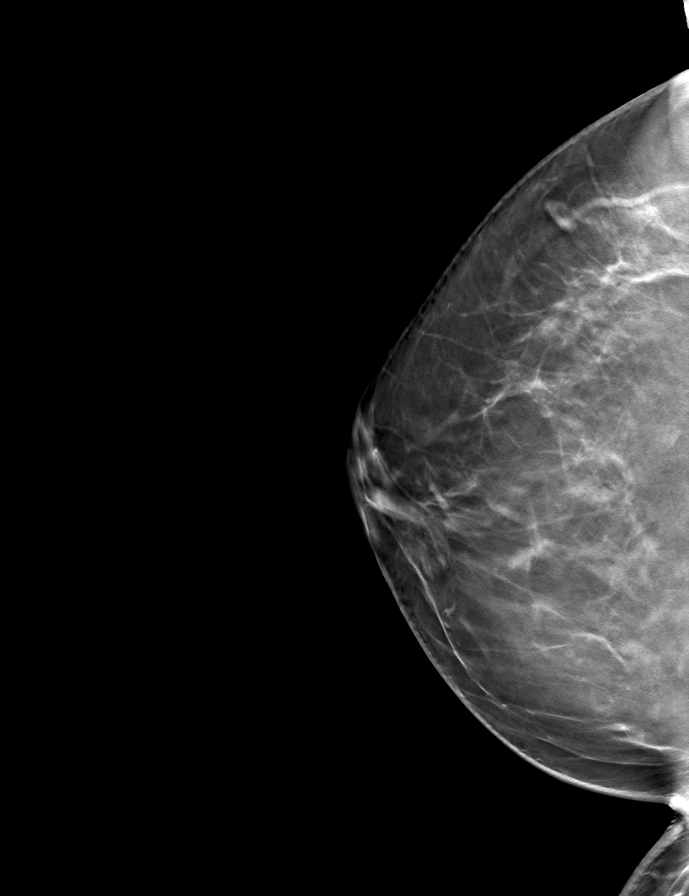

[R MLO tomo · tomo slice 40/79.0]
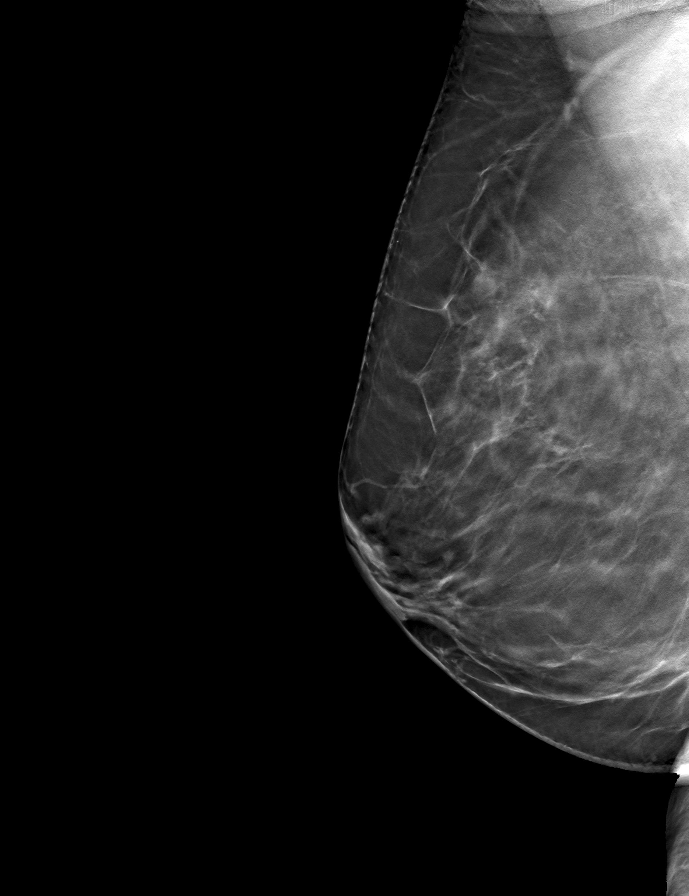

[9 of 24 positions shown; findings below may reference images not displayed]

ACR Breast Density Category b: There are scattered areas of
fibroglandular density.
FINDINGS: There are no findings suspicious for malignancy.
IMPRESSION: No mammographic evidence of malignancy. A result letter of this
screening mammogram will be mailed directly to the patient.

RECOMMENDATION:
Screening mammogram in one year. (Code:51-O-LD2)

BI-RADS CATEGORY  1: Negative.

## 2022-12-07 DIAGNOSIS — J45909 Unspecified asthma, uncomplicated: Secondary | ICD-10-CM | POA: Diagnosis not present

## 2022-12-07 DIAGNOSIS — N182 Chronic kidney disease, stage 2 (mild): Secondary | ICD-10-CM | POA: Diagnosis not present

## 2022-12-07 DIAGNOSIS — H409 Unspecified glaucoma: Secondary | ICD-10-CM | POA: Diagnosis not present

## 2022-12-07 DIAGNOSIS — E785 Hyperlipidemia, unspecified: Secondary | ICD-10-CM | POA: Diagnosis not present

## 2022-12-07 DIAGNOSIS — N393 Stress incontinence (female) (male): Secondary | ICD-10-CM | POA: Diagnosis not present

## 2022-12-07 DIAGNOSIS — M8580 Other specified disorders of bone density and structure, unspecified site: Secondary | ICD-10-CM | POA: Diagnosis not present

## 2022-12-07 DIAGNOSIS — M1990 Unspecified osteoarthritis, unspecified site: Secondary | ICD-10-CM | POA: Diagnosis not present

## 2022-12-07 DIAGNOSIS — K5900 Constipation, unspecified: Secondary | ICD-10-CM | POA: Diagnosis not present

## 2022-12-07 DIAGNOSIS — K219 Gastro-esophageal reflux disease without esophagitis: Secondary | ICD-10-CM | POA: Diagnosis not present

## 2022-12-10 DIAGNOSIS — H524 Presbyopia: Secondary | ICD-10-CM | POA: Diagnosis not present

## 2022-12-10 DIAGNOSIS — H5203 Hypermetropia, bilateral: Secondary | ICD-10-CM | POA: Diagnosis not present

## 2022-12-10 DIAGNOSIS — H10233 Serous conjunctivitis, except viral, bilateral: Secondary | ICD-10-CM | POA: Diagnosis not present

## 2022-12-10 DIAGNOSIS — H52223 Regular astigmatism, bilateral: Secondary | ICD-10-CM | POA: Diagnosis not present

## 2022-12-17 DIAGNOSIS — H401232 Low-tension glaucoma, bilateral, moderate stage: Secondary | ICD-10-CM | POA: Diagnosis not present

## 2022-12-17 DIAGNOSIS — H524 Presbyopia: Secondary | ICD-10-CM | POA: Diagnosis not present

## 2022-12-17 DIAGNOSIS — H52223 Regular astigmatism, bilateral: Secondary | ICD-10-CM | POA: Diagnosis not present

## 2022-12-17 DIAGNOSIS — H5203 Hypermetropia, bilateral: Secondary | ICD-10-CM | POA: Diagnosis not present

## 2022-12-17 DIAGNOSIS — Z961 Presence of intraocular lens: Secondary | ICD-10-CM | POA: Diagnosis not present

## 2023-02-07 DIAGNOSIS — G4733 Obstructive sleep apnea (adult) (pediatric): Secondary | ICD-10-CM | POA: Diagnosis not present

## 2023-03-25 DIAGNOSIS — R946 Abnormal results of thyroid function studies: Secondary | ICD-10-CM | POA: Diagnosis not present

## 2023-03-25 DIAGNOSIS — I10 Essential (primary) hypertension: Secondary | ICD-10-CM | POA: Diagnosis not present

## 2023-03-25 DIAGNOSIS — Z0001 Encounter for general adult medical examination with abnormal findings: Secondary | ICD-10-CM | POA: Diagnosis not present

## 2023-03-25 DIAGNOSIS — R7303 Prediabetes: Secondary | ICD-10-CM | POA: Diagnosis not present

## 2023-03-25 DIAGNOSIS — D709 Neutropenia, unspecified: Secondary | ICD-10-CM | POA: Diagnosis not present

## 2023-03-25 DIAGNOSIS — Z23 Encounter for immunization: Secondary | ICD-10-CM | POA: Diagnosis not present

## 2023-03-25 DIAGNOSIS — M549 Dorsalgia, unspecified: Secondary | ICD-10-CM | POA: Diagnosis not present

## 2023-03-25 DIAGNOSIS — E6609 Other obesity due to excess calories: Secondary | ICD-10-CM | POA: Diagnosis not present

## 2023-03-25 DIAGNOSIS — J4520 Mild intermittent asthma, uncomplicated: Secondary | ICD-10-CM | POA: Diagnosis not present

## 2023-03-25 DIAGNOSIS — Z1331 Encounter for screening for depression: Secondary | ICD-10-CM | POA: Diagnosis not present

## 2023-03-25 DIAGNOSIS — E1159 Type 2 diabetes mellitus with other circulatory complications: Secondary | ICD-10-CM | POA: Diagnosis not present

## 2023-04-02 DIAGNOSIS — H524 Presbyopia: Secondary | ICD-10-CM | POA: Diagnosis not present

## 2023-04-02 DIAGNOSIS — H52223 Regular astigmatism, bilateral: Secondary | ICD-10-CM | POA: Diagnosis not present

## 2023-04-02 DIAGNOSIS — Z961 Presence of intraocular lens: Secondary | ICD-10-CM | POA: Diagnosis not present

## 2023-04-02 DIAGNOSIS — H401232 Low-tension glaucoma, bilateral, moderate stage: Secondary | ICD-10-CM | POA: Diagnosis not present

## 2023-04-02 DIAGNOSIS — H5203 Hypermetropia, bilateral: Secondary | ICD-10-CM | POA: Diagnosis not present

## 2023-04-03 ENCOUNTER — Other Ambulatory Visit: Payer: Self-pay | Admitting: Family Medicine

## 2023-04-03 DIAGNOSIS — Z1231 Encounter for screening mammogram for malignant neoplasm of breast: Secondary | ICD-10-CM

## 2023-04-23 DIAGNOSIS — I10 Essential (primary) hypertension: Secondary | ICD-10-CM | POA: Diagnosis not present

## 2023-04-23 DIAGNOSIS — N390 Urinary tract infection, site not specified: Secondary | ICD-10-CM | POA: Diagnosis not present

## 2023-04-23 DIAGNOSIS — E6609 Other obesity due to excess calories: Secondary | ICD-10-CM | POA: Diagnosis not present

## 2023-04-23 DIAGNOSIS — E782 Mixed hyperlipidemia: Secondary | ICD-10-CM | POA: Diagnosis not present

## 2023-04-23 DIAGNOSIS — Z6830 Body mass index (BMI) 30.0-30.9, adult: Secondary | ICD-10-CM | POA: Diagnosis not present

## 2023-05-07 ENCOUNTER — Ambulatory Visit
Admission: RE | Admit: 2023-05-07 | Discharge: 2023-05-07 | Disposition: A | Discharge status: Home / Self Care | Payer: Medicare PPO | Source: Ambulatory Visit | Attending: Family Medicine | Admitting: Family Medicine

## 2023-05-07 DIAGNOSIS — Z1231 Encounter for screening mammogram for malignant neoplasm of breast: Secondary | ICD-10-CM | POA: Diagnosis not present

## 2023-05-09 DIAGNOSIS — G4733 Obstructive sleep apnea (adult) (pediatric): Secondary | ICD-10-CM | POA: Diagnosis not present

## 2023-05-21 ENCOUNTER — Other Ambulatory Visit (HOSPITAL_COMMUNITY): Payer: Self-pay | Admitting: Family Medicine

## 2023-05-21 DIAGNOSIS — Z6830 Body mass index (BMI) 30.0-30.9, adult: Secondary | ICD-10-CM | POA: Diagnosis not present

## 2023-05-21 DIAGNOSIS — E6609 Other obesity due to excess calories: Secondary | ICD-10-CM | POA: Diagnosis not present

## 2023-05-21 DIAGNOSIS — M549 Dorsalgia, unspecified: Secondary | ICD-10-CM | POA: Diagnosis not present

## 2023-05-28 ENCOUNTER — Ambulatory Visit (HOSPITAL_COMMUNITY)
Admission: RE | Admit: 2023-05-28 | Discharge: 2023-05-28 | Disposition: A | Discharge status: Home / Self Care | Payer: Medicare PPO | Source: Ambulatory Visit | Attending: Family Medicine | Admitting: Family Medicine

## 2023-05-28 DIAGNOSIS — M549 Dorsalgia, unspecified: Secondary | ICD-10-CM | POA: Insufficient documentation

## 2023-05-28 DIAGNOSIS — R102 Pelvic and perineal pain: Secondary | ICD-10-CM | POA: Diagnosis not present

## 2023-05-28 DIAGNOSIS — N390 Urinary tract infection, site not specified: Secondary | ICD-10-CM | POA: Diagnosis not present

## 2023-06-03 DIAGNOSIS — N3020 Other chronic cystitis without hematuria: Secondary | ICD-10-CM | POA: Diagnosis not present

## 2023-06-03 DIAGNOSIS — R350 Frequency of micturition: Secondary | ICD-10-CM | POA: Diagnosis not present

## 2023-07-22 DIAGNOSIS — H5203 Hypermetropia, bilateral: Secondary | ICD-10-CM | POA: Diagnosis not present

## 2023-07-22 DIAGNOSIS — H52223 Regular astigmatism, bilateral: Secondary | ICD-10-CM | POA: Diagnosis not present

## 2023-07-22 DIAGNOSIS — H524 Presbyopia: Secondary | ICD-10-CM | POA: Diagnosis not present

## 2023-07-22 DIAGNOSIS — Z961 Presence of intraocular lens: Secondary | ICD-10-CM | POA: Diagnosis not present

## 2023-07-22 DIAGNOSIS — H401232 Low-tension glaucoma, bilateral, moderate stage: Secondary | ICD-10-CM | POA: Diagnosis not present

## 2023-08-02 ENCOUNTER — Ambulatory Visit: Payer: Medicare PPO | Attending: Cardiology | Admitting: Cardiology

## 2023-08-02 ENCOUNTER — Encounter: Payer: Self-pay | Admitting: Cardiology

## 2023-08-02 VITALS — BP 102/56 | HR 69 | Ht 66.0 in | Wt 173.0 lb

## 2023-08-02 DIAGNOSIS — R002 Palpitations: Secondary | ICD-10-CM

## 2023-08-02 DIAGNOSIS — R200 Anesthesia of skin: Secondary | ICD-10-CM | POA: Diagnosis not present

## 2023-08-02 DIAGNOSIS — E785 Hyperlipidemia, unspecified: Secondary | ICD-10-CM

## 2023-08-02 DIAGNOSIS — K219 Gastro-esophageal reflux disease without esophagitis: Secondary | ICD-10-CM | POA: Diagnosis not present

## 2023-08-02 DIAGNOSIS — R072 Precordial pain: Secondary | ICD-10-CM

## 2023-08-02 DIAGNOSIS — Z0000 Encounter for general adult medical examination without abnormal findings: Secondary | ICD-10-CM | POA: Diagnosis not present

## 2023-08-02 DIAGNOSIS — R202 Paresthesia of skin: Secondary | ICD-10-CM | POA: Diagnosis not present

## 2023-08-02 DIAGNOSIS — I10 Essential (primary) hypertension: Secondary | ICD-10-CM | POA: Diagnosis not present

## 2023-08-02 NOTE — Patient Instructions (Signed)
Medication Instructions:  No changes *If you need a refill on your cardiac medications before your next appointment, please call your pharmacy*   Lab Work: Not needed   Testing/Procedures: Not needed   Follow-Up: At [facility], you and your health needs are our priority.  As part of our continuing mission to provide you with exceptional heart care, we have created designated Provider Care Teams.  These Care Teams include your primary Cardiologist (physician) and Advanced Practice Providers (APPs -  Physician Assistants and Nurse Practitioners) who all work together to provide you with the care you need, when you need it.  We recommend signing up for the patient portal called "MyChart".  Sign up information is provided on this After Visit Summary.  MyChart is used to connect with patients for Virtual Visits (Telemedicine).  Patients are able to view lab/test results, encounter notes, upcoming appointments, etc.  Non-urgent messages can be sent to your provider as well.   To learn more about what you can do with MyChart, go to [email or web address].    Your next appointment:   12 month(s)  The format for your next appointment:   In Person  Provider:   Bryan Lemma, MD

## 2023-08-02 NOTE — Progress Notes (Unsigned)
Cardiology Office Note:  .   Date:  08/04/2023  ID:  Danielle, [name] 1940-07-18, MRN [id number] PCP: Assunta Found, MD  [facility] Providers Cardiologist:  Bryan Lemma, MD     Chief Complaint  Patient presents with   Follow-up    12 months.  Doing well.  No complaints    Patient Profile: Marland Kitchen     Danielle [name] is a 83 y.o. female HTN HLD (reassuring Coronary CTA) with palpitations with a PMH notable for annual follow-up who presents here for annual follow-up at the request of Assunta Found, MD.  Her husband is also a patient of Dr. Herbie Baltimore.   Danielle [name] was last seen in January 2024 to follow-up after Coronary CTA done in December 2023 for some chest discomfort symptoms.  Very reassuring findings.  No changes made.  Subjective  Discussed the use of AI scribe software for clinical note transcription with the patient, who gave verbal consent to proceed.  History of Present Illness   The patient, an 83 year old with a history of controlled hypertension and hyperlipidemia, reports no significant changes in health over the past year. They have not experienced any hospitalizations or notable health events. The patient's coronary CT scan from the previous year showed a reassuring coronary calcium score of 24, indicating mild plaque in one artery. They deny any recent episodes of palpitations, heart racing, or skipping.  The patient admits to being less active than usual, but denies any chest pain or pressure during physical activity such as walking or climbing stairs. They also deny any symptoms of dizziness, lightheadedness, shortness of breath, leg swelling, or sudden onset of weakness or numbness.  The patient does report occasional leg tingling, particularly when sitting for extended periods at their computer. This is likely due to prolonged pressure on the nerves and veins in the legs.  The patient's current medication regimen includes HCTZ 25 for blood  pressure, rosuvastatin 10 for cholesterol, fish oil caplets, and aspirin. They also take Prevacid approximately three times a week for acid reflux. The patient has an albuterol inhaler and Flonase, both of which were used only once in the past year.  The patient's EKG shows a slight delay in the electrical conduction to the right ventricle, but this has been a consistent finding and is not considered a concern. The patient's cholesterol levels, A1c, blood count, kidney function, and thyroid levels are all within normal limits.  In summary, the patient is managing their chronic conditions well and reports no significant changes or concerns over the past year. They are encouraged to stay active and continue their current medication regimen.     Cardiovascular ROS: no chest pain or dyspnea on exertion negative for - edema, irregular heartbeat, orthopnea, palpitations, paroxysmal nocturnal dyspnea, rapid heart rate, shortness of breath, or lightheadedness, dizziness or wooziness, syncope or near syncope, TIA or amaurosis fugax. She does note that she is probably not as active as she would like to be and maybe has a little bit of deconditioning related exercise intolerance.    Objective   Medications - HCTZ 25 mg - Rosuvastatin 10 mg - Fish oil caplets 1000  daily - Aspirin 81 mg  - Prevacid (as needed) 3-4 x per week - Albuterol (as needed) Rare - Flonase (as needed) Rare  Studies Reviewed: Marland Kitchen   EKG Interpretation Date/Time:  Friday August 02 2023 09:06:51 EST Ventricular Rate:  69 PR Interval:  180 QRS Duration:  88 QT Interval:  386  QTC Calculation: 413 R Axis:   -41  Text Interpretation: Normal sinus rhythm Left axis deviation Minimal voltage criteria for LVH, may be normal variant ( R in aVL ) When compared with ECG of 01-Jul-2017 06:52, No significant change since last tracing Confirmed by Bryan Lemma ([id number]) on 08/02/2023 9:11:48 AM    Coronary CTA 06/13/2022: No notable  noncardiac findings.  Coronary Calcium Score is 24-43rd percentile.  Minimal proximal calcified plaque in the LAD (0-24%).  Very low risk, reassuring finding.  No results found for: "CHOL", "HDL", "LDLCALC", "LDLDIRECT", "TRIG", "CHOLHDL" Results LABS (03/26/2023): Cholesterol: 177 mg/dL; HDL: 76 mg/dL; LDL: 86 mg/dL; Triglycerides: 80 mg/dL; U0A: 5.4%; Hgb 13, TSH: 2.18 mIU/L; (05/22/2023) Creatinine: 0.82 mg/dL, K+ 3.8  Risk Assessment/Calculations:         Physical Exam:   VS:  BP (!) 102/56 (BP Location: Left Arm, Patient Position: Sitting, Cuff Size: Normal)   Pulse 69   Ht 5\' 6"  (1.676 m)   Wt 173 lb (78.5 kg)   BMI 27.92 kg/m    Wt Readings from Last 3 Encounters:  08/02/23 173 lb (78.5 kg)  07/31/22 172 lb (78 kg)  05/11/22 174 lb 9.6 oz (79.2 kg)    GEN: Well nourished, well groomed ;in no acute distress; healthy-appearing NECK: No JVD; No carotid bruits CARDIAC: Normal S1, S2; RRR, no murmurs, rubs, gallops RESPIRATORY:  Clear to auscultation without rales, wheezing or rhonchi ; nonlabored, good air movement. ABDOMEN: Soft, non-tender, non-distended EXTREMITIES:  No edema; No deformity      ASSESSMENT AND PLAN: .    Problem List Items Addressed This Visit       Cardiology Problems   Essential hypertension (Chronic)   Borderline hypotensive today on current l dose of HCTZ 25 mg daily. - Low threshold to reduce dose to 12.5 mg daily.      Relevant Orders   EKG 12-Lead (Completed)   Hyperlipidemia with target LDL less than 100 - Primary (Chronic)   Most recent LDL was 76.  Well-controlled based on restratification from coronary calcium scoring/Coronary CTA. -Continue rosuvastatin 10 mg daily along with fish oil.        Other   Encounter for annual cardiovascular exam (Chronic)   Cardiovascular Health No symptoms of chest pain, palpitations, dizziness, or shortness of breath. No leg swelling. Mild plaque in coronary arteries as per last year's coronary CT  scan. EKG shows minor conduction delay in the right ventricle, unchanged from previous years. -Continue current management with HCTZ 25mg  for blood pressure and rosuvastatin 10mg  for cholesterol control. -Continue aspirin and fish oil caplets.      GERD (gastroesophageal reflux disease) (Chronic)   On Prevacid, taken 3 times a week or as needed. -Continue current management with Prevacid as needed.      Intermittent palpitations (Chronic)   Well-controlled.  Usually more associated with stress or anxiety or lack of rest.  Will try to avoid treatment.      Numbness and tingling of leg   Reports occasional tingling in legs when sitting for prolonged periods, likely due to pressure on nerves and veins. -Advise to adjust sitting position, stand, or walk around periodically to improve circulation and nerve function.      Precordial pain   Rare intermittent symptoms.  Evaluated with a coronary CTA that was essentially nonischemic with minimal disease.      Relevant Orders   EKG 12-Lead (Completed)     Asthma Minimal use of albuterol, only once last year. -  Continue current management with albuterol as needed.  Allergic Rhinitis Minimal use of Flonase, only one refill last year. -Continue current management with Flonase as needed.  Follow-up Patient wishes to maintain yearly appointments.  She is however a candidate for graduation from ongoing cardiology care. Follow-Up: Return in about 1 year (around 08/01/2024) for Routine follow up with me.     Signed, Marykay Lex, MD, MS Bryan Lemma, M.D., M.S. Interventional Cardiologist  [facility]  Pager # [phone] Phone # [phone] [address] [place], [place] [address]

## 2023-08-04 ENCOUNTER — Encounter: Payer: Self-pay | Admitting: Cardiology

## 2023-08-04 DIAGNOSIS — R200 Anesthesia of skin: Secondary | ICD-10-CM | POA: Insufficient documentation

## 2023-08-04 NOTE — Assessment & Plan Note (Signed)
Borderline hypotensive today on current l dose of HCTZ 25 mg daily. - Low threshold to reduce dose to 12.5 mg daily.

## 2023-08-04 NOTE — Assessment & Plan Note (Signed)
Cardiovascular Health No symptoms of chest pain, palpitations, dizziness, or shortness of breath. No leg swelling. Mild plaque in coronary arteries as per last year's coronary CT scan. EKG shows minor conduction delay in the right ventricle, unchanged from previous years. -Continue current management with HCTZ 25mg  for blood pressure and rosuvastatin 10mg  for cholesterol control. -Continue aspirin and fish oil caplets.

## 2023-08-04 NOTE — Assessment & Plan Note (Signed)
Most recent LDL was 76.  Well-controlled based on restratification from coronary calcium scoring/Coronary CTA. -Continue rosuvastatin 10 mg daily along with fish oil.

## 2023-08-04 NOTE — Assessment & Plan Note (Signed)
Reports occasional tingling in legs when sitting for prolonged periods, likely due to pressure on nerves and veins. -Advise to adjust sitting position, stand, or walk around periodically to improve circulation and nerve function.

## 2023-08-04 NOTE — Assessment & Plan Note (Signed)
Well-controlled.  Usually more associated with stress or anxiety or lack of rest.  Will try to avoid treatment.

## 2023-08-04 NOTE — Assessment & Plan Note (Signed)
Rare intermittent symptoms.  Evaluated with a coronary CTA that was essentially nonischemic with minimal disease.

## 2023-08-04 NOTE — Assessment & Plan Note (Signed)
On Prevacid, taken 3 times a week or as needed. -Continue current management with Prevacid as needed.

## 2023-08-21 DIAGNOSIS — G4733 Obstructive sleep apnea (adult) (pediatric): Secondary | ICD-10-CM | POA: Diagnosis not present

## 2023-08-23 DIAGNOSIS — Z20828 Contact with and (suspected) exposure to other viral communicable diseases: Secondary | ICD-10-CM | POA: Diagnosis not present

## 2023-08-23 DIAGNOSIS — E6609 Other obesity due to excess calories: Secondary | ICD-10-CM | POA: Diagnosis not present

## 2023-08-23 DIAGNOSIS — U071 COVID-19: Secondary | ICD-10-CM | POA: Diagnosis not present

## 2023-08-23 DIAGNOSIS — Z6829 Body mass index (BMI) 29.0-29.9, adult: Secondary | ICD-10-CM | POA: Diagnosis not present

## 2023-08-23 DIAGNOSIS — J029 Acute pharyngitis, unspecified: Secondary | ICD-10-CM | POA: Diagnosis not present

## 2023-10-25 ENCOUNTER — Other Ambulatory Visit: Payer: Self-pay | Admitting: Cardiology

## 2023-11-04 DIAGNOSIS — H52223 Regular astigmatism, bilateral: Secondary | ICD-10-CM | POA: Diagnosis not present

## 2023-11-04 DIAGNOSIS — H401232 Low-tension glaucoma, bilateral, moderate stage: Secondary | ICD-10-CM | POA: Diagnosis not present

## 2023-11-04 DIAGNOSIS — H524 Presbyopia: Secondary | ICD-10-CM | POA: Diagnosis not present

## 2023-11-04 DIAGNOSIS — Z961 Presence of intraocular lens: Secondary | ICD-10-CM | POA: Diagnosis not present

## 2023-11-04 DIAGNOSIS — H5203 Hypermetropia, bilateral: Secondary | ICD-10-CM | POA: Diagnosis not present

## 2023-11-21 DIAGNOSIS — G4733 Obstructive sleep apnea (adult) (pediatric): Secondary | ICD-10-CM | POA: Diagnosis not present

## 2023-12-04 DIAGNOSIS — N393 Stress incontinence (female) (male): Secondary | ICD-10-CM | POA: Diagnosis not present

## 2023-12-04 DIAGNOSIS — N3020 Other chronic cystitis without hematuria: Secondary | ICD-10-CM | POA: Diagnosis not present

## 2023-12-04 DIAGNOSIS — N952 Postmenopausal atrophic vaginitis: Secondary | ICD-10-CM | POA: Diagnosis not present

## 2023-12-25 DIAGNOSIS — M1611 Unilateral primary osteoarthritis, right hip: Secondary | ICD-10-CM | POA: Diagnosis not present

## 2023-12-25 DIAGNOSIS — Z6829 Body mass index (BMI) 29.0-29.9, adult: Secondary | ICD-10-CM | POA: Diagnosis not present

## 2023-12-25 DIAGNOSIS — E663 Overweight: Secondary | ICD-10-CM | POA: Diagnosis not present

## 2024-02-20 DIAGNOSIS — H401232 Low-tension glaucoma, bilateral, moderate stage: Secondary | ICD-10-CM | POA: Diagnosis not present

## 2024-02-20 DIAGNOSIS — Z961 Presence of intraocular lens: Secondary | ICD-10-CM | POA: Diagnosis not present

## 2024-02-21 DIAGNOSIS — D709 Neutropenia, unspecified: Secondary | ICD-10-CM | POA: Diagnosis not present

## 2024-02-21 DIAGNOSIS — Z6829 Body mass index (BMI) 29.0-29.9, adult: Secondary | ICD-10-CM | POA: Diagnosis not present

## 2024-02-21 DIAGNOSIS — J4520 Mild intermittent asthma, uncomplicated: Secondary | ICD-10-CM | POA: Diagnosis not present

## 2024-02-21 DIAGNOSIS — R946 Abnormal results of thyroid function studies: Secondary | ICD-10-CM | POA: Diagnosis not present

## 2024-02-21 DIAGNOSIS — E782 Mixed hyperlipidemia: Secondary | ICD-10-CM | POA: Diagnosis not present

## 2024-02-21 DIAGNOSIS — R7309 Other abnormal glucose: Secondary | ICD-10-CM | POA: Diagnosis not present

## 2024-02-21 DIAGNOSIS — E663 Overweight: Secondary | ICD-10-CM | POA: Diagnosis not present

## 2024-02-21 DIAGNOSIS — G4761 Periodic limb movement disorder: Secondary | ICD-10-CM | POA: Diagnosis not present

## 2024-02-21 DIAGNOSIS — I10 Essential (primary) hypertension: Secondary | ICD-10-CM | POA: Diagnosis not present

## 2024-02-21 DIAGNOSIS — R7303 Prediabetes: Secondary | ICD-10-CM | POA: Diagnosis not present

## 2024-02-25 DIAGNOSIS — G4733 Obstructive sleep apnea (adult) (pediatric): Secondary | ICD-10-CM | POA: Diagnosis not present

## 2024-03-31 ENCOUNTER — Other Ambulatory Visit: Payer: Self-pay | Admitting: Family Medicine

## 2024-03-31 DIAGNOSIS — Z1231 Encounter for screening mammogram for malignant neoplasm of breast: Secondary | ICD-10-CM

## 2024-04-13 ENCOUNTER — Emergency Department (HOSPITAL_COMMUNITY)

## 2024-04-13 ENCOUNTER — Other Ambulatory Visit: Payer: Self-pay

## 2024-04-13 ENCOUNTER — Encounter (HOSPITAL_COMMUNITY): Payer: Self-pay | Admitting: Emergency Medicine

## 2024-04-13 ENCOUNTER — Emergency Department (HOSPITAL_COMMUNITY)
Admission: EM | Admit: 2024-04-13 | Discharge: 2024-04-13 | Disposition: A | Discharge status: Home / Self Care | Attending: Emergency Medicine | Admitting: Emergency Medicine

## 2024-04-13 DIAGNOSIS — Z7982 Long term (current) use of aspirin: Secondary | ICD-10-CM | POA: Diagnosis not present

## 2024-04-13 DIAGNOSIS — I10 Essential (primary) hypertension: Secondary | ICD-10-CM | POA: Insufficient documentation

## 2024-04-13 DIAGNOSIS — R0789 Other chest pain: Secondary | ICD-10-CM | POA: Insufficient documentation

## 2024-04-13 LAB — BASIC METABOLIC PANEL WITH GFR
Anion gap: 12 (ref 5–15)
BUN: 18 mg/dL (ref 8–23)
CO2: 26 mmol/L (ref 22–32)
Calcium: 9.5 mg/dL (ref 8.9–10.3)
Chloride: 102 mmol/L (ref 98–111)
Creatinine, Ser: 0.77 mg/dL (ref 0.44–1.00)
GFR, Estimated: >60 mL/min (ref >60)
Glucose, Bld: 126 mg/dL — ABNORMAL HIGH (ref 70–99)
Potassium: 3.9 mmol/L (ref 3.5–5.1)
Sodium: 140 mmol/L (ref 135–145)

## 2024-04-13 LAB — CBC
HCT: 34.8 % — ABNORMAL LOW (ref 36.0–46.0)
Hemoglobin: 11.8 g/dL — ABNORMAL LOW (ref 12.0–15.0)
MCH: 30.3 pg (ref 26.0–34.0)
MCHC: 33.9 g/dL (ref 30.0–36.0)
MCV: 89.5 fL (ref 80.0–100.0)
Platelets: 222 K/uL (ref 150–400)
RBC: 3.89 MIL/uL (ref 3.87–5.11)
RDW: 13.8 % (ref 11.5–15.5)
WBC: 5 K/uL (ref 4.0–10.5)
nRBC: 0 % (ref 0.0–0.2)

## 2024-04-13 LAB — TROPONIN T, HIGH SENSITIVITY: Troponin T High Sensitivity: <15 ng/L (ref 0–19)

## 2024-04-13 NOTE — ED Triage Notes (Signed)
 Pt arrives POV w/ c/o chest tightness & SHOB x 2 weeks. Worsening tonight. Reports feeling slightly nauseous tonight.  Hx asthma.

## 2024-04-13 NOTE — ED Provider Notes (Signed)
 [place] EMERGENCY DEPARTMENT AT [facility] Provider Note   CSN: [id number] Arrival date & time: 04/13/24  0002     Patient presents with: Chest Pain   Danielle [name] is a 83 y.o. female.   Patient is an 83 year old female with history of hypertension, GERD, hyperlipidemia.  Patient presenting today with complaints of chest discomfort.  Symptoms have been occurring intermittently and randomly for the past 2 weeks.  She describes a pressure in the bottom of her chest with no associated nausea, diaphoresis, or radiation to the arm or jaw.  She denies experiencing any exertional symptoms.  No leg swelling or calf pain.       Prior to Admission medications   Medication Sig Start Date End Date Taking? Authorizing Provider  albuterol  (PROVENTIL  HFA;VENTOLIN  HFA) 108 (90 BASE) MCG/ACT inhaler Inhale 2 puffs into the lungs every 6 (six) hours as needed for wheezing. For shortness of breath    [provider]  aspirin  EC 81 MG tablet Take 81 mg by mouth at bedtime. Swallow whole.    [provider]  betamethasone dipropionate 0.05 % cream Apply 1 application topically 2 (two) times daily as needed (skin irritation.). 05/04/21   [provider]  Cholecalciferol (VITAMIN D3) 50 MCG (2000 UT) TABS Take 2,000 Units by mouth in the morning.    [provider]  ferrous sulfate 325 (65 FE) MG tablet Take 325 mg by mouth every 14 (fourteen) days.    [provider]  fluticasone  (FLOVENT  HFA) 110 MCG/ACT inhaler Inhale 1 puff into the lungs daily as needed (FOR SHORTNESS OF BREATH). For shortness of breath    [provider]  hydrochlorothiazide  (HYDRODIURIL ) 25 MG tablet TAKE 1 TABLET(25 MG) BY MOUTH DAILY 10/25/23   Anner Alm ORN, MD  lansoprazole (PREVACID) 30 MG capsule Take 30 mg by mouth daily before breakfast.    [provider]  Multiple Vitamin (MULTIVITAMIN WITH MINERALS) TABS tablet Take 1 tablet by mouth in the  morning. Centrum Silver    [provider]  NON FORMULARY at bedtime. Uses cpap at night    [provider]  Omega-3 Fatty Acids (FISH OIL OMEGA-3) 1000 MG CAPS Take 1,000 mg by mouth in the morning.    [provider]  Potassium 99 MG TABS Take 99 mg by mouth daily as needed (cramping).    [provider]  rosuvastatin (CRESTOR) 10 MG tablet Take 10 mg by mouth at bedtime.    [provider]    Allergies: Trilyte [peg 3350 -kcl-na bicarb-nacl]    Review of Systems  All other systems reviewed and are negative.   Updated Vital Signs BP (!) 133/59   Pulse 63   Temp 97.7 F (36.5 C) (Oral)   Resp 16   SpO2 100%   Physical Exam Vitals and nursing note reviewed.  Constitutional:      General: She is not in acute distress.    Appearance: She is well-developed. She is not diaphoretic.  HENT:     Head: Normocephalic and atraumatic.  Cardiovascular:     Rate and Rhythm: Normal rate and regular rhythm.     Heart sounds: No murmur heard.    No friction rub. No gallop.  Pulmonary:     Effort: Pulmonary effort is normal. No respiratory distress.     Breath sounds: Normal breath sounds. No wheezing.  Abdominal:     General: Bowel sounds are normal. There is no distension.  Palpations: Abdomen is soft.     Tenderness: There is no abdominal tenderness.  Musculoskeletal:        General: Normal range of motion.     Cervical back: Normal range of motion and neck supple.     Right lower leg: No tenderness. No edema.     Left lower leg: No tenderness. No edema.  Skin:    General: Skin is warm and dry.  Neurological:     General: No focal deficit present.     Mental Status: She is alert and oriented to person, place, and time.     (all labs ordered are listed, but only abnormal results are displayed) Labs Reviewed  CBC - Abnormal; Notable for the following components:      Result Value   Hemoglobin 11.8 (*)    HCT 34.8 (*)    All  other components within normal limits  BASIC METABOLIC PANEL WITH GFR  TROPONIN T, HIGH SENSITIVITY    EKG: EKG Interpretation Date/Time:  Monday April 13 2024 00:12:28 EDT Ventricular Rate:  60 PR Interval:  184 QRS Duration:  92 QT Interval:  424 QTC Calculation: 424 R Axis:   -58  Text Interpretation: Sinus rhythm Left anterior fascicular block Low voltage, precordial leads Consider anterior infarct Confirmed by Geroldine Berg ([id number]) on 04/13/2024 12:18:05 AM  Radiology: No results found.   Procedures   Medications Ordered in the ED - No data to display                                  Medical Decision Making Amount and/or Complexity of Data Reviewed Labs: ordered. Radiology: ordered.   Patient is an 83 year old female with history of hypertension, GERD, and hyperlipidemia presenting with chest discomfort as described in the HPI.  Patient arrives here with stable vital signs and is afebrile.  Physical examination is unremarkable.  Laboratory studies obtained including CBC and basic metabolic panel, both of which are unremarkable.  Troponin is negative.  Chest x-ray obtained showing no acute process.  At this point, I doubt an emergent cause of her chest discomfort.  Symptoms are atypical and have been ongoing for 2 weeks with normal workup.  She also had a coronary CT several years ago showing minimal calcification and no significant stenosis.  I suspect symptoms are likely GERD.  I will recommend increasing her Prevacid to twice daily for the next 1 to 2 weeks and see if this helps.  I have also considered pulmonary embolism, but heart rate is in the 50s and oxygen saturations are 100%.  I also doubt aortic dissection and there is no evidence for pneumothorax on the chest x-ray.     Final diagnoses:  None    ED Discharge Orders     None          Geroldine Berg, MD 04/13/24 [phone]

## 2024-04-13 NOTE — Discharge Instructions (Signed)
 Increase your lansoprazole to 30 mg twice daily for the next 1 to 2 weeks.  Rest.  Follow-up with primary doctor if symptoms persist, and return to the ER if you develop worsening chest pain, difficulty breathing, or for other new and concerning symptoms.

## 2024-05-07 ENCOUNTER — Ambulatory Visit
Admission: RE | Admit: 2024-05-07 | Discharge: 2024-05-07 | Disposition: A | Discharge status: Home / Self Care | Source: Ambulatory Visit | Attending: Family Medicine | Admitting: Family Medicine

## 2024-05-07 DIAGNOSIS — Z1231 Encounter for screening mammogram for malignant neoplasm of breast: Secondary | ICD-10-CM

## 2024-05-12 ENCOUNTER — Ambulatory Visit: Attending: Cardiology | Admitting: Cardiology

## 2024-05-12 ENCOUNTER — Encounter: Payer: Self-pay | Admitting: Cardiology

## 2024-05-12 VITALS — BP 138/68 | HR 70 | Ht 65.0 in | Wt 172.0 lb

## 2024-05-12 DIAGNOSIS — I10 Essential (primary) hypertension: Secondary | ICD-10-CM | POA: Diagnosis not present

## 2024-05-12 DIAGNOSIS — R002 Palpitations: Secondary | ICD-10-CM

## 2024-05-12 DIAGNOSIS — K219 Gastro-esophageal reflux disease without esophagitis: Secondary | ICD-10-CM

## 2024-05-12 DIAGNOSIS — R072 Precordial pain: Secondary | ICD-10-CM

## 2024-05-12 DIAGNOSIS — E785 Hyperlipidemia, unspecified: Secondary | ICD-10-CM

## 2024-05-12 NOTE — Patient Instructions (Addendum)
 Medication Instructions:  No changes  *If you need a refill on your cardiac medications before your next appointment, please call your pharmacy*   Lab Work: Not needed If you have labs (blood work) drawn today and your tests are completely normal, you will receive your results only by: MyChart Message (if you have MyChart) OR A paper copy in the mail If you have any lab test that is abnormal or we need to change your treatment, we will call you to review the results.   Testing/Procedures: Not needed   Follow-Up: At [facility], you and your health needs are our priority.  As part of our continuing mission to provide you with exceptional heart care, we have created designated Provider Care Teams.  These Care Teams include your primary Cardiologist (physician) and Advanced Practice Providers (APPs -  Physician Assistants and Nurse Practitioners) who all work together to provide you with the care you need, when you need it.     Your next appointment:   6 to 15month(s)  The format for your next appointment:   In Person  Provider:   Alm Clay, MD

## 2024-05-12 NOTE — Progress Notes (Signed)
 [facility] Office Note:  .   Date:  05/17/2024  ID:  Danielle [name], DOB 03/06/1941, MRN [id number] PCP: [facility]  [facility] Providers Cardiologist:  Alm Clay, MD     Chief Complaint  Patient presents with   Hypertension   Hospitalization Follow-up    ER Visit - Chest Discomfort -> last month.  No longer having symptoms.    Patient Profile: Danielle     Danielle [name] is a healthy-appearing/very pleasant 83 y.o. female  with a PMH reviewed below who presents here for earlier than expected visit as a hospital follow-up at the request of Dr. Geroldine.  PMH: HTN, HLD (reassuring Coronary CTA) with palpitations   Her husband Danielle [name] is also a patient of Dr. Clay.     Danielle [name] was last seen on August 02, 2023-doing well.  No major issues.  Occasional chest discomfort but not associate with exertion.  Was very active with no chest pain with on exertion.  Negative cardiac ROS.  No change to medications.  Plan was for annual follow-up, despite being a candidate for graduation to PCP for  She was seen at the [facility] emergency room on 04/13/2024 for chest pain felt to be related to GERD.  But she had also been doing her body exercises and may have pulled a muscle.  Subjective  Discussed the use of AI scribe software for clinical note transcription with the patient, who gave verbal consent to proceed.  History of Present Illness Danielle [name] is an 83 year old female who presents with a history of chest tightness.  She has experienced intermittent chest tightness for a couple of weeks, which prompted a visit to the emergency room at [facility]. The tightness has since resolved. During the episode, her Prevacid dosage was increased from one to two tablets once a day, but she has since returned to her usual dose of one tablet daily. She reports that the onset of symptoms occurred while she was lifting weights and questioned whether  this activity could have contributed.  No associated symptoms such as shortness of breath, palpitations, dizziness, or lightheadedness. She has a history of asthma but reports no significant respiratory symptoms during the episodes of chest tightness. She has not really had any further episodes over the last week or 2..  She is back to exercising and denies any chest pain pressure or dyspnea.  Her current medications include Prevacid (Lansoprazole) taken daily before breakfast, hydrochlorothiazide  for blood pressure, Crestor 10 mg, and aspirin  81 mg. She mentions that her blood pressure was slightly elevated during the visit, possibly due to stress, but it is generally well-controlled.  She also reports experiencing leg tingling around 11 or 12 PM, which she discussed with her previous doctor, who suggested regular use of her CPAP machine might help. She does not experience any leg swelling.   Additional Cardiovascular ROS: positive for - chest pain and no longer noting symptoms negative for - dyspnea on exertion, edema, irregular heartbeat, orthopnea, palpitations, paroxysmal nocturnal dyspnea, rapid heart rate, shortness of breath, or syncope or near STEMI, TIA ramus VS, claudication  ROS:  Review of Systems - n/a    Objective   Medications - HCTZ 25 mg - Rosuvastatin 10 mg; - Fish oil caplets 1000  daily - Aspirin  81 mg  - Prevacid 30 mg daily - Albuterol  (as needed) Rare - Flonase  (as needed) Rare  Studies Reviewed: .  No results found for: CHOL, HDL, LDLCALC, LDLDIRECT, TRIG, CHOLHDL Lab Results  Component Value Date   NA 140 04/13/2024   K 3.9 04/13/2024   CREATININE 0.77 04/13/2024   GFRNONAA >60 04/13/2024   GLUCOSE 126 (H) 04/13/2024    Results LABS Total cholesterol: 163 (02/21/2024) Triglycerides: 53 (02/21/2024) HDL: 69 (02/21/2024) LDL: 83 (02/21/2024) TSH: 8.58 (02/21/2024) Hemoglobin: 11.8 (04/13/2024) Platelets: 222  (04/13/2024) Creatinine: 0.77 (04/13/2024) Glucose: 126 (04/13/2024)  RADIOLOGY Coronary CTA 06/13/2022: No notable noncardiac findings.  Coronary Calcium Score is 24-43rd percentile.  Minimal proximal calcified plaque in the LAD (0-24%).  Very low risk, reassuring finding.   Risk Assessment/Calculations:            Physical Exam:   VS:  BP 138/68   Pulse 70   Ht 5' 5 (1.651 m)   Wt 172 lb (78 kg)   SpO2 98%   BMI 28.62 kg/m    Wt Readings from Last 3 Encounters:  05/12/24 172 lb (78 kg)  08/02/23 173 lb (78.5 kg)  07/31/22 172 lb (78 kg)    Physical Exam PULMONARY: Lungs are clear to auscultation bilaterally. Nonlabored breathing. Good air movement.   GEN: Well nourished, well groomed in no acute distress; healthy-appearing.  Appears younger than stated age. NECK: No JVD; No carotid bruits CARDIAC: Normal S1, S2; RRR, no murmurs, rubs, gallops; mild precordial tenderness on palpation along bilateral costosternal borders.  Mostly in the upper 2 ribs RESPIRATORY:  Clear to auscultation without rales, wheezing or rhonchi ; nonlabored, good air movement. ABDOMEN: Soft, non-tender, non-distended EXTREMITIES:  No edema; No deformity      ASSESSMENT AND PLAN: .    Problem List Items Addressed This Visit       Cardiology Problems   Essential hypertension - Primary (Chronic)   Blood pressure slightly elevated, likely stress-related. Well-controlled on hydrochlorothiazide . No symptoms of headaches, blurred vision, dizziness, or leg swelling. - Continue hydrochlorothiazide  25 mg daily      Hyperlipidemia with target LDL less than 100 (Chronic)   Lipid panel shows well-controlled levels with current medication. - Continue current lipid-lowering therapy: .  Rosuvastatin 10 mL daily  She is also taking 81 mg aspirin  for prophylaxis.  Okay to hold for procedures          Other   GERD (gastroesophageal reflux disease) (Chronic)   Chest pain symptoms also improved with  lansoprazole, occasionally taken every other day. - Continue lansoprazole as needed.      Intermittent palpitations (Chronic)   Pretty well-controlled.  Not on any medications.  Likely related to stress.      Precordial pain   Again rare remittent symptoms.  This last episode was associated with doing upper body exercises.  I think she may have strained some muscles.  She has costosternal tenderness likely related to mild costochondritis.  Unlikely be cardiac in nature as her Coronary Calcium Score is berry reassuring for someone her age.  We discussed treating with NSAIDs and heat pad.            Follow-Up: Return in about 6 months (around 11/09/2024) for 6 month follow-up with me, Danielle [name].      Signed, Alm MICAEL Clay, MD, MS Alm Clay, M.D., M.S. Interventional Cardiologist  [facility] Pager # [phone]

## 2024-05-17 ENCOUNTER — Encounter: Payer: Self-pay | Admitting: Cardiology

## 2024-05-17 NOTE — Assessment & Plan Note (Signed)
 Chest pain symptoms also improved with lansoprazole, occasionally taken every other day. - Continue lansoprazole as needed.

## 2024-05-17 NOTE — Assessment & Plan Note (Addendum)
 Lipid panel shows well-controlled levels with current medication. - Continue current lipid-lowering therapy: .  Rosuvastatin 10 mL daily  She is also taking 81 mg aspirin  for prophylaxis.  Okay to hold for procedures

## 2024-05-17 NOTE — Assessment & Plan Note (Addendum)
 Again rare remittent symptoms.  This last episode was associated with doing upper body exercises.  I think she may have strained some muscles.  She has costosternal tenderness likely related to mild costochondritis.  Unlikely be cardiac in nature as her Coronary Calcium Score is berry reassuring for someone her age.  We discussed treating with NSAIDs and heat pad.

## 2024-05-17 NOTE — Assessment & Plan Note (Signed)
 Blood pressure slightly elevated, likely stress-related. Well-controlled on hydrochlorothiazide . No symptoms of headaches, blurred vision, dizziness, or leg swelling. - Continue hydrochlorothiazide  25 mg daily

## 2024-05-17 NOTE — Assessment & Plan Note (Signed)
 Pretty well-controlled.  Not on any medications.  Likely related to stress.
